# Patient Record
Sex: Male | Born: 1975 | Race: Black or African American | Hispanic: No | Marital: Married | Smoking: Never smoker
Health system: Southern US, Community
[De-identification: ages and names within clinical notes are randomized; demographics above are authoritative.]

## PROBLEM LIST (undated history)

## (undated) DIAGNOSIS — M25562 Pain in left knee: Secondary | ICD-10-CM

## (undated) DIAGNOSIS — S8990XA Unspecified injury of unspecified lower leg, initial encounter: Secondary | ICD-10-CM

## (undated) DIAGNOSIS — Z87438 Personal history of other diseases of male genital organs: Secondary | ICD-10-CM

## (undated) DIAGNOSIS — J309 Allergic rhinitis, unspecified: Secondary | ICD-10-CM

## (undated) DIAGNOSIS — I10 Essential (primary) hypertension: Secondary | ICD-10-CM

## (undated) DIAGNOSIS — M25561 Pain in right knee: Secondary | ICD-10-CM

## (undated) DIAGNOSIS — S8992XA Unspecified injury of left lower leg, initial encounter: Secondary | ICD-10-CM

## (undated) DIAGNOSIS — M545 Low back pain, unspecified: Secondary | ICD-10-CM

## (undated) DIAGNOSIS — Z973 Presence of spectacles and contact lenses: Secondary | ICD-10-CM

## (undated) DIAGNOSIS — A6000 Herpesviral infection of urogenital system, unspecified: Secondary | ICD-10-CM

## (undated) DIAGNOSIS — J45909 Unspecified asthma, uncomplicated: Secondary | ICD-10-CM

## (undated) HISTORY — DX: Low back pain: M54.5

## (undated) HISTORY — DX: Pain in right knee: M25.561

## (undated) HISTORY — DX: Herpesviral infection of urogenital system, unspecified: A60.00

## (undated) HISTORY — DX: Unspecified asthma, uncomplicated: J45.909

## (undated) HISTORY — DX: Unspecified injury of unspecified lower leg, initial encounter: S89.90XA

## (undated) HISTORY — DX: Unspecified injury of left lower leg, initial encounter: S89.92XA

## (undated) HISTORY — PX: NO PAST SURGERIES: SHX2092

## (undated) HISTORY — DX: Pain in left knee: M25.562

## (undated) HISTORY — DX: Low back pain, unspecified: M54.50

## (undated) HISTORY — DX: Essential (primary) hypertension: I10

## (undated) HISTORY — DX: Allergic rhinitis, unspecified: J30.9

## (undated) HISTORY — DX: Presence of spectacles and contact lenses: Z97.3

## (undated) HISTORY — DX: Personal history of other diseases of male genital organs: Z87.438

---

## 2013-09-29 ENCOUNTER — Encounter (HOSPITAL_COMMUNITY): Payer: Self-pay | Admitting: Emergency Medicine

## 2013-09-29 ENCOUNTER — Emergency Department (HOSPITAL_COMMUNITY)
Admission: EM | Admit: 2013-09-29 | Discharge: 2013-09-29 | Disposition: A | Discharge status: Home / Self Care | Payer: BC Managed Care – PPO | Source: Home / Self Care

## 2013-09-29 DIAGNOSIS — J309 Allergic rhinitis, unspecified: Secondary | ICD-10-CM

## 2013-09-29 DIAGNOSIS — R0982 Postnasal drip: Secondary | ICD-10-CM

## 2013-09-29 DIAGNOSIS — J029 Acute pharyngitis, unspecified: Secondary | ICD-10-CM

## 2013-09-29 LAB — POCT RAPID STREP A: STREPTOCOCCUS, GROUP A SCREEN (DIRECT): NEGATIVE

## 2013-09-29 NOTE — ED Provider Notes (Signed)
CSN: [id number]     Arrival date & time 09/29/13  1019 History   First MD Initiated Contact with Patient 09/29/13 1056     Chief Complaint  Patient presents with  . Sore Throat   (Consider location/radiation/quality/duration/timing/severity/associated sxs/prior Treatment) HPI Comments: 38 year old male complaining of a sore throat for 2 weeks. This is associated with nasal congestion and PND. He has been taking Zyrtec, Sudafed and Flonase on intermittent basis. When he began to feel better he will stop taking the medication. Denies fever, earache, cough or shortness of breath.   History reviewed. No pertinent past medical history. History reviewed. No pertinent past surgical history. History reviewed. No pertinent family history. History  Substance Use Topics  . Smoking status: Never Smoker   . Smokeless tobacco: Not on file  . Alcohol Use: Yes    Review of Systems  Constitutional: Negative for fever, diaphoresis, activity change and fatigue.  HENT: Positive for postnasal drip, rhinorrhea and sore throat. Negative for ear pain, facial swelling and trouble swallowing.   Eyes: Negative for pain, discharge and redness.  Respiratory: Negative for cough, chest tightness and shortness of breath.   Cardiovascular: Negative.   Gastrointestinal: Negative.   Musculoskeletal: Negative.  Negative for neck pain and neck stiffness.  Skin: Negative for rash.  Neurological: Negative.     Allergies  Review of patient's allergies indicates no known allergies.  Home Medications   Prior to Admission medications   Not on File   BP 157/61  Pulse 105  Temp(Src) 98.9 F (37.2 C) (Oral)  Resp 18  SpO2 97% Physical Exam  Nursing note and vitals reviewed. Constitutional: He is oriented to person, place, and time. He appears well-developed and well-nourished. No distress.  HENT:  Mouth/Throat: No oropharyngeal exudate.  Bilateral TMs are normal Oropharynx with moderate erythema, cobblestoning  and clear PND  Eyes: Conjunctivae and EOM are normal.  Neck: Normal range of motion. Neck supple.  Cardiovascular: Normal rate, regular rhythm and normal heart sounds.   Pulmonary/Chest: Effort normal and breath sounds normal. No respiratory distress. He has no wheezes. He has no rales.  Musculoskeletal: Normal range of motion. He exhibits no edema.  Lymphadenopathy:    He has no cervical adenopathy.  Neurological: He is alert and oriented to person, place, and time.  Skin: Skin is warm and dry. No rash noted.  Psychiatric: He has a normal mood and affect.    ED Course  Procedures (including critical care time) Labs Review Labs Reviewed  POCT RAPID STREP A ([facility]RG CARE ONLY)    Imaging Review No results found.   MDM   1. Allergic rhinitis   2. Allergic pharyngitis   3. PND (post-nasal drip)    FLonase, zyrtec,nasal saline, plenty of fluids, cepacol Rica Mast, NP 09/29/13 1115

## 2013-09-29 NOTE — ED Provider Notes (Signed)
Medical screening examination/treatment/procedure(s) were performed by non-physician practitioner and as supervising physician I was immediately available for consultation/collaboration.  Leslee Home, M.D.  Reuben Likes, MD 09/29/13 [phone]

## 2013-09-29 NOTE — ED Notes (Signed)
Reported 2 week duration of ST. Minimal relief w OTC medications, hot tea. NAD

## 2013-09-29 NOTE — Discharge Instructions (Signed)
Allergic Rhinitis COntinue daily zyrtec or allegra Flonase nasal spray Saline nasal spray as needed cloroseptic lozenges Lots of fluids Ibuprofen 800 mg every 8 hours as needed Sore Throat A sore throat is pain, burning, irritation, or scratchiness of the throat. There is often pain or tenderness when swallowing or talking. A sore throat may be accompanied by other symptoms, such as coughing, sneezing, fever, and swollen neck glands. A sore throat is often the first sign of another sickness, such as a cold, flu, strep throat, or mononucleosis (commonly known as mono). Most sore throats go away without medical treatment. CAUSES  The most common causes of a sore throat include:  A viral infection, such as a cold, flu, or mono.  A bacterial infection, such as strep throat, tonsillitis, or whooping cough.  Seasonal allergies.  Dryness in the air.  Irritants, such as smoke or pollution.  Gastroesophageal reflux disease (GERD). HOME CARE INSTRUCTIONS   Only take over-the-counter medicines as directed by your caregiver.  Drink enough fluids to keep your urine clear or pale yellow.  Rest as needed.  Try using throat sprays, lozenges, or sucking on hard candy to ease any pain (if older than 4 years or as directed).  Sip warm liquids, such as broth, herbal tea, or warm water with honey to relieve pain temporarily. You may also eat or drink cold or frozen liquids such as frozen ice pops.  Gargle with salt water (mix 1 tsp salt with 8 oz of water).  Do not smoke and avoid secondhand smoke.  Put a cool-mist humidifier in your bedroom at night to moisten the air. You can also turn on a hot shower and sit in the bathroom with the door closed for 5 10 minutes. SEEK IMMEDIATE MEDICAL CARE IF:  You have difficulty breathing.  You are unable to swallow fluids, soft foods, or your saliva.  You have increased swelling in the throat.  Your sore throat does not get better in 7 days.  You  have nausea and vomiting.  You have a fever or persistent symptoms for more than 2 3 days.  You have a fever and your symptoms suddenly get worse. MAKE SURE YOU:   Understand these instructions.  Will watch your condition.  Will get help right away if you are not doing well or get worse. Document Released: 05/23/2004 Document Revised: 04/01/2012 Document Reviewed: 12/22/2011 [facility] Patient Information 2014 [place].  Sore Throat A sore throat is pain, burning, irritation, or scratchiness of the throat. There is often pain or tenderness when swallowing or talking. A sore throat may be accompanied by other symptoms, such as coughing, sneezing, fever, and swollen neck glands. A sore throat is often the first sign of another sickness, such as a cold, flu, strep throat, or mononucleosis (commonly known as mono). Most sore throats go away without medical treatment. CAUSES  The most common causes of a sore throat include:  A viral infection, such as a cold, flu, or mono.  A bacterial infection, such as strep throat, tonsillitis, or whooping cough.  Seasonal allergies.  Dryness in the air.  Irritants, such as smoke or pollution.  Gastroesophageal reflux disease (GERD). HOME CARE INSTRUCTIONS   Only take over-the-counter medicines as directed by your caregiver.  Drink enough fluids to keep your urine clear or pale yellow.  Rest as needed.  Try using throat sprays, lozenges, or sucking on hard candy to ease any pain (if older than 4 years or as directed).  Sip warm  liquids, such as broth, herbal tea, or warm water with honey to relieve pain temporarily. You may also eat or drink cold or frozen liquids such as frozen ice pops.  Gargle with salt water (mix 1 tsp salt with 8 oz of water).  Do not smoke and avoid secondhand smoke.  Put a cool-mist humidifier in your bedroom at night to moisten the air. You can also turn on a hot shower and sit in the bathroom with the door  closed for 5 10 minutes. SEEK IMMEDIATE MEDICAL CARE IF:  You have difficulty breathing.  You are unable to swallow fluids, soft foods, or your saliva.  You have increased swelling in the throat.  Your sore throat does not get better in 7 days.  You have nausea and vomiting.  You have a fever or persistent symptoms for more than 2 3 days.  You have a fever and your symptoms suddenly get worse. MAKE SURE YOU:   Understand these instructions.  Will watch your condition.  Will get help right away if you are not doing well or get worse. Document Released: 05/23/2004 Document Revised: 04/01/2012 Document Reviewed: 12/22/2011 [facility] Patient Information 2014 [place], [place].  Allergic rhinitis is when the mucous membranes in the nose respond to allergens. Allergens are particles in the air that cause your body to have an allergic reaction. This causes you to release allergic antibodies. Through a chain of events, these eventually cause you to release histamine into the blood stream. Although meant to protect the body, it is this release of histamine that causes your discomfort, such as frequent sneezing, congestion, and an itchy, runny nose.  CAUSES  Seasonal allergic rhinitis (hay fever) is caused by pollen allergens that may come from grasses, trees, and weeds. Year-round allergic rhinitis (perennial allergic rhinitis) is caused by allergens such as house dust mites, pet dander, and mold spores.  SYMPTOMS   Nasal stuffiness (congestion).  Itchy, runny nose with sneezing and tearing of the eyes. DIAGNOSIS  Your health care provider can help you determine the allergen or allergens that trigger your symptoms. If you and your health care provider are unable to determine the allergen, skin or blood testing may be used. TREATMENT  Allergic Rhinitis does not have a cure, but it can be controlled by:  Medicines and allergy shots (immunotherapy).  Avoiding the allergen. Hay fever may  often be treated with antihistamines in pill or nasal spray forms. Antihistamines block the effects of histamine. There are over-the-counter medicines that may help with nasal congestion and swelling around the eyes. Check with your health care provider before taking or giving this medicine.  If avoiding the allergen or the medicine prescribed do not work, there are many new medicines your health care provider can prescribe. Stronger medicine may be used if initial measures are ineffective. Desensitizing injections can be used if medicine and avoidance does not work. Desensitization is when a patient is given ongoing shots until the body becomes less sensitive to the allergen. Make sure you follow up with your health care provider if problems continue. HOME CARE INSTRUCTIONS It is not possible to completely avoid allergens, but you can reduce your symptoms by taking steps to limit your exposure to them. It helps to know exactly what you are allergic to so that you can avoid your specific triggers. SEEK MEDICAL CARE IF:   You have a fever.  You develop a cough that does not stop easily (persistent).  You have shortness of breath.  You  start wheezing.  Symptoms interfere with normal daily activities. Document Released: 01/08/2001 Document Revised: 02/03/2013 Document Reviewed: 12/21/2012 [facility] Patient Information 2014 [place], [place].

## 2013-10-01 LAB — CULTURE, GROUP A STREP

## 2014-07-20 ENCOUNTER — Ambulatory Visit (INDEPENDENT_AMBULATORY_CARE_PROVIDER_SITE_OTHER): Payer: BLUE CROSS/BLUE SHIELD | Admitting: Medical

## 2014-07-20 ENCOUNTER — Encounter: Payer: Self-pay | Admitting: Medical

## 2014-07-20 VITALS — BP 130/90 | HR 76 | Temp 98.4°F | Resp 15 | Ht 77.0 in | Wt 266.0 lb

## 2014-07-20 DIAGNOSIS — Z113 Encounter for screening for infections with a predominantly sexual mode of transmission: Secondary | ICD-10-CM | POA: Diagnosis not present

## 2014-07-20 DIAGNOSIS — M25561 Pain in right knee: Secondary | ICD-10-CM

## 2014-07-20 DIAGNOSIS — K4090 Unilateral inguinal hernia, without obstruction or gangrene, not specified as recurrent: Secondary | ICD-10-CM | POA: Diagnosis not present

## 2014-07-20 DIAGNOSIS — M25562 Pain in left knee: Secondary | ICD-10-CM | POA: Diagnosis not present

## 2014-07-20 DIAGNOSIS — Z23 Encounter for immunization: Secondary | ICD-10-CM

## 2014-07-20 DIAGNOSIS — M545 Low back pain: Secondary | ICD-10-CM | POA: Diagnosis not present

## 2014-07-20 DIAGNOSIS — R030 Elevated blood-pressure reading, without diagnosis of hypertension: Secondary | ICD-10-CM

## 2014-07-20 DIAGNOSIS — Z0000 Encounter for general adult medical examination without abnormal findings: Secondary | ICD-10-CM

## 2014-07-20 LAB — CBC WITH DIFFERENTIAL/PLATELET
BASOS PCT: 1 % (ref 0–1)
Basophils Absolute: 0.1 10*3/uL (ref 0.0–0.1)
EOS ABS: 0.2 10*3/uL (ref 0.0–0.7)
Eosinophils Relative: 3 % (ref 0–5)
HEMATOCRIT: 43.4 % (ref 39.0–52.0)
HEMOGLOBIN: 14.1 g/dL (ref 13.0–17.0)
Lymphocytes Relative: 32 % (ref 12–46)
Lymphs Abs: 2.4 10*3/uL (ref 0.7–4.0)
MCH: 27 pg (ref 26.0–34.0)
MCHC: 32.5 g/dL (ref 30.0–36.0)
MCV: 83.1 fL (ref 78.0–100.0)
MONO ABS: 0.5 10*3/uL (ref 0.1–1.0)
MONOS PCT: 7 % (ref 3–12)
MPV: 10.4 fL (ref 8.6–12.4)
Neutro Abs: 4.3 10*3/uL (ref 1.7–7.7)
Neutrophils Relative %: 57 % (ref 43–77)
Platelets: 269 10*3/uL (ref 150–400)
RBC: 5.22 MIL/uL (ref 4.22–5.81)
RDW: 13.3 % (ref 11.5–15.5)
WBC: 7.6 10*3/uL (ref 4.0–10.5)

## 2014-07-20 LAB — POCT URINALYSIS DIPSTICK
BILIRUBIN UA: NEGATIVE
Glucose, UA: NEGATIVE
KETONES UA: NEGATIVE
Leukocytes, UA: NEGATIVE
Nitrite, UA: NEGATIVE
Protein, UA: NEGATIVE
RBC UA: NEGATIVE
SPEC GRAV UA: 1.025
Urobilinogen, UA: NEGATIVE
pH, UA: 6.5

## 2014-07-20 LAB — COMPREHENSIVE METABOLIC PANEL
ALBUMIN: 4.5 g/dL (ref 3.5–5.2)
ALK PHOS: 44 U/L (ref 39–117)
ALT: 15 U/L (ref 0–53)
AST: 16 U/L (ref 0–37)
BUN: 12 mg/dL (ref 6–23)
CO2: 28 meq/L (ref 19–32)
CREATININE: 1.03 mg/dL (ref 0.50–1.35)
Calcium: 9.7 mg/dL (ref 8.4–10.5)
Chloride: 102 mEq/L (ref 96–112)
GLUCOSE: 82 mg/dL (ref 70–99)
Potassium: 4.1 mEq/L (ref 3.5–5.3)
SODIUM: 140 meq/L (ref 135–145)
Total Bilirubin: 1 mg/dL (ref 0.2–1.2)
Total Protein: 6.9 g/dL (ref 6.0–8.3)

## 2014-07-20 LAB — LIPID PANEL
Cholesterol: 174 mg/dL (ref 0–200)
HDL: 43 mg/dL (ref >=40)
LDL CALC: 115 mg/dL — AB (ref 0–99)
Total CHOL/HDL Ratio: 4 Ratio
Triglycerides: 82 mg/dL (ref <150)
VLDL: 16 mg/dL (ref 0–40)

## 2014-07-20 LAB — TSH: TSH: 2.14 u[IU]/mL (ref 0.350–4.500)

## 2014-07-20 NOTE — Progress Notes (Signed)
Subjective:   HPI  Carl Wallace is a 39 y.o. male who presents for a complete physical.  New patient today.    Preventative care: Last ophthalmology visit: yes [facility] [place] Last dental visit:yes Dr. Alvester MorinBell Last colonoscopy:n/a Last prostate exam: n/a Last EKG:n/a Last labs:new  Prior vaccinations: TD or Tdap:unsure Influenza:never Pneumococcal:n/a Shingles/Zostavax:n/a  Concerns: Notes that he exercises with walking every day at work, runs sometimes, plays basketball couple times a month but normally plays 2 or 3 full court games in a row. Lately he seems to get bilateral knee pain and swelling a few hours after plan basketball, he does note an injury to the left knee in the remote past with tears of the muscles but didn't end up needing surgery. He also gets low back pain from time to time, has thought about seeing a chiropractor  Reviewed their medical, surgical, family, social, medication, and allergy history and updated chart as appropriate.  Past Medical History  Diagnosis Date  . Childhood asthma   . Allergic rhinitis   . Wears glasses     alternates between glasses and contacts  . Genital herpes   . Knee pain, bilateral   . Low back pain   . Knee injury     left knee remote past  . H/O testicular mass     evaluatd by Urology 2006  . Left knee injury     remote past with muscle tear    Past Surgical History  Procedure Laterality Date  . No past surgeries      History   Social History  . Marital Status: Married    Spouse Name: N/A  . Number of Children: N/A  . Years of Education: N/A   Occupational History  . Not on file.   Social History Main Topics  . Smoking status: Never Smoker   . Smokeless tobacco: Not on file  . Alcohol Use: 0.6 oz/week    1 Cans of beer per week  . Drug Use: No  . Sexual Activity: Not on file   Other Topics Concern  . Not on file   Social History Narrative   Married, has 2 children,  5yo and 7yo, exercise some with walking, basetball. Non smoker, christian.  Production designer, theatre/television/filmManager at [employer] [place]    Family History  Problem Relation Age of Onset  . Hypertension Mother   . Hypertension Brother   . Asthma Daughter   . Diabetes Maternal Grandmother   . Hypertension Maternal Grandmother     No current outpatient prescriptions on file.  No Known Allergies   Review of Systems Constitutional: -fever, -chills, -sweats, -unexpected weight change, -decreased appetite, -fatigue Allergy: -sneezing, -itching, -congestion Dermatology: -changing moles, --rash, -lumps ENT: -runny nose, -ear pain, -sore throat, -hoarseness, -sinus pain, -teeth pain, - ringing in ears, -hearing loss, -nosebleeds Cardiology: -chest pain, -palpitations, -swelling, -difficulty breathing when lying flat, -waking up short of breath Respiratory: -cough, -shortness of breath, -difficulty breathing with exercise or exertion, -wheezing, -coughing up blood Gastroenterology: -abdominal pain, -nausea, -vomiting, -diarrhea, -constipation, -blood in stool, -changes in bowel movement, -difficulty swallowing or eating Hematology: -bleeding, -bruising  Musculoskeletal: +joint aches(Knee), -muscle aches, -joint swelling, +back pain, -neck pain, -cramping, -changes in gait Ophthalmology: denies vision changes, eye redness, itching, discharge Urology: -burning with urination, -difficulty urinating, -blood in urine, -urinary frequency, -urgency, -incontinence Neurology: -headache, -weakness, -tingling, -numbness, -memory loss, -falls, -dizziness Psychology: -depressed mood, -agitation, -sleep problems     Objective:  Physical Exam  BP 130/90 mmHg  Pulse 76  Temp(Src) 98.4 F (36.9 C) (Oral)  Resp 15  Ht  (1.956 m)  Wt 266 lb (120.657 kg)  BMI 31.54 kg/m2  General appearance: alert, no distress, WD/WN,  Skin: Right arm with small brown 3 mm diameter flat round macule of right forearm volar surface,  right lateral elbow with 2 mm raised brown papule unchanged for years, tattoo right upper arm, no worrisome lesions HEENT: normocephalic, conjunctiva/corneas normal, sclerae anicteric, PERRLA, EOMi, nares patent, no discharge or erythema, pharynx normal Oral cavity: MMM, tongue normal, teeth in good repair Neck: supple, no lymphadenopathy, no thyromegaly, no masses, normal ROM, no bruits Chest: non tender, normal shape and expansion Heart: RRR, normal S1, S2, no murmurs Lungs: CTA bilaterally, no wheezes, rhonchi, or rales Abdomen: +bs, soft, non tender, non distended, no masses, no hepatomegaly, no splenomegaly, no bruits Back: non tender, normal ROM, no scoliosis Musculoskeletal: upper extremities non tender, no obvious deformity, normal ROM throughout, lower extremities non tender, no obvious deformity, normal ROM throughout Extremities: no edema, no cyanosis, no clubbing Pulses: 2+ symmetric, upper and lower extremities, normal cap refill Neurological: alert, oriented x 3, CN2-12 intact, strength normal upper extremities and lower extremities, sensation normal throughout, DTRs 2+ throughout, no cerebellar signs, gait normal Psychiatric: normal affect, behavior normal, pleasant  GU: normal male external genitalia, circumcised , nontender, no masses, small left inguinal hernia reducible, no lymphadenopathy Rectal: deferred   Assessment and Plan :      Encounter Diagnoses  Name Primary?  . Encounter for health maintenance examination in adult Yes  . Screen for STD (sexually transmitted disease)   . Need for TD vaccine   . Need for prophylactic vaccination and inoculation against influenza   . Elevated blood pressure reading without diagnosis of hypertension   . Bilateral knee pain   . Low back pain, unspecified back pain laterality, with sciatica presence unspecified   . Inguinal hernia, left     Physical exam - discussed healthy lifestyle, diet, exercise, preventative care,  vaccinations, and addressed their concerns.   Routine labs today and STD labs today Counseled on the Td (tetanus, diptheria) vaccine.  Vaccine information sheet given. Td vaccine given after consent obtained. Counseled on the influenza virus vaccine.  Vaccine information sheet given.  Influenza vaccine given after consent obtained. See your eye doctor yearly for routine vision care. See your dentist yearly for routine dental care including hygiene visits twice yearly. Discussed his concerns of bilateral knee pain and back pain, advised he try to play basketball on a regular basis instead of just every now and then as he is probably overusing and overdoing it in relation to his joints. Whereas if he would gradually build up his tolerance and endurance and use this type of exercise regularly he probably wouldn't have the same issues advised he try ibuprofen prior to exercise and then ice and elevation after exercise and 6 hours later after the first ibuprofen doing a second round of ibuprofen area also discussed daily stretching routine and daily exercise Follow-up pending labs

## 2014-07-21 ENCOUNTER — Other Ambulatory Visit: Payer: Self-pay | Admitting: Medical

## 2014-07-21 ENCOUNTER — Encounter: Payer: Self-pay | Admitting: Medical

## 2014-07-21 DIAGNOSIS — M545 Low back pain, unspecified: Secondary | ICD-10-CM | POA: Insufficient documentation

## 2014-07-21 DIAGNOSIS — M25562 Pain in left knee: Secondary | ICD-10-CM

## 2014-07-21 DIAGNOSIS — R030 Elevated blood-pressure reading, without diagnosis of hypertension: Secondary | ICD-10-CM | POA: Insufficient documentation

## 2014-07-21 DIAGNOSIS — M25561 Pain in right knee: Secondary | ICD-10-CM | POA: Insufficient documentation

## 2014-07-21 LAB — HIV ANTIBODY (ROUTINE TESTING W REFLEX): HIV 1&2 Ab, 4th Generation: NONREACTIVE

## 2014-07-21 LAB — RPR

## 2014-07-21 LAB — GC/CHLAMYDIA PROBE AMP
CT PROBE, AMP APTIMA: NEGATIVE
GC Probe RNA: NEGATIVE

## 2014-07-21 MED ORDER — VALACYCLOVIR HCL 500 MG PO TABS: ORAL_TABLET | ORAL | Status: DC

## 2014-07-21 NOTE — Progress Notes (Signed)
LM to [facility] 

## 2014-08-11 ENCOUNTER — Encounter: Payer: Self-pay | Admitting: Medical

## 2014-08-11 ENCOUNTER — Ambulatory Visit (INDEPENDENT_AMBULATORY_CARE_PROVIDER_SITE_OTHER): Payer: BLUE CROSS/BLUE SHIELD | Admitting: Medical

## 2014-08-11 VITALS — BP 132/90 | HR 78 | Temp 97.9°F | Resp 16 | Wt 266.0 lb

## 2014-08-11 DIAGNOSIS — M545 Low back pain: Secondary | ICD-10-CM

## 2014-08-11 DIAGNOSIS — M25562 Pain in left knee: Secondary | ICD-10-CM

## 2014-08-11 DIAGNOSIS — M25561 Pain in right knee: Secondary | ICD-10-CM

## 2014-08-11 DIAGNOSIS — R030 Elevated blood-pressure reading, without diagnosis of hypertension: Secondary | ICD-10-CM | POA: Diagnosis not present

## 2014-08-11 DIAGNOSIS — N528 Other male erectile dysfunction: Secondary | ICD-10-CM

## 2014-08-11 MED ORDER — SILDENAFIL CITRATE 50 MG PO TABS: ORAL_TABLET | ORAL | Status: DC

## 2014-08-11 NOTE — Progress Notes (Signed)
Subjective: Here for f/u on several issues  He reports problems with erections/sexual function.   Married, says relationship with wife is good, but sexual function/sexual relations not what is use to be.   Sometimes erections not full, sometimes reaches ejaculation too fast, sometimes just doesn't seem to have the ability to achieve climax as in the past.   Often intercourse is more the act and less foreplay than in the past.  Doesn't get morning erections like in the past.   Does get erections with intercourse most of the time, and no problems with masturbation.   Was curious about Viagra.  No prior treatment or eval with medication.  Has had recent elevated BPs but no diagnosis of HTN, no prior medication for this.   He denies chest pain, shortness breath, palpitations, edema.  He reports ongoing problems with his knees and back.  He reports that he exercises with walking every day at work, runs sometimes, plays basketball couple times a month but normally plays 2 or 3 full court games in a row. Lately he seems to get bilateral knee pain and swelling a few hours after plan basketball, he does note an injury to the left knee in the remote past with tears of the muscles but didn't end up needing surgery. The knee pain is generally at the joint line bilaterally of both knees He also gets low back pain from time to time, has thought about seeing a chiropractor.  No pain radiating from the back to the legs, no numbness or tingling or weakness.  Review of systems as in subjective  Objective: BP 132/90 mmHg  Pulse 78  Temp(Src) 97.9 F (36.6 C) (Oral)  Resp 16  Wt 266 lb (120.657 kg)  BP Readings from Last 3 Encounters:  08/11/14 132/90  07/20/14 130/90  09/29/13 157/61    General appearance: alert, no distress, WD/WN,  Heart: RRR, normal S1, S2, no murmurs Lungs: CTA bilaterally, no wheezes, rhonchi, or rales Abdomen: +bs, soft, non tender, non distended, no masses, no hepatomegaly, no  splenomegaly, no bruits Back: non tender, normal ROM, no scoliosis Musculoskeletal: Bilateral knees without tenderness no swelling no deformity normal range of motion no laxity, basically normal exam. rest of leg exam unremarkable Extremities: no edema, no cyanosis, no clubbing Pulses: 2+ symmetric, upper and lower extremities, normal cap refill Neurological: Normal leg sensation strength and DTRs   Assessment: Encounter Diagnoses  Name Primary?  . Other male erectile dysfunction Yes  . Elevated blood pressure reading without diagnosis of hypertension   . Knee pain, bilateral   . Low back pain without sciatica, unspecified back pain laterality     Plan: Erectile Dysfunction - Reviewed pathophysiology and differential diagnosis of erectile dysfunction with the patient.  Discussed treatment options. I suspect he has a combination of ED related to psychological factors, lack of foreplay, needing to work with wife to make the environment conducive for intimacy, counseled in general on setting the tone for things to work fine.  He also seems to have HTN, so discussed the role of BP and organic causes of ED.  Begin trial of Viagra.  Discussed potential risks of medications including hypotension and priapism.  Discussed proper use of medication.  Questions were answered.  Recheck with call back in a week  Elevated BP - he will check BPs weekly, and let me know readings in 1-2 weeks.  Discussed likely diagnosis of hypertension  Knee pain bilat - go for xrays, begin Aleve OTC prn or  daily for the next week or 2, can do ice when worse, swollen, leg elevation and relative rest.  Back pain - pending knee xrays, consider PT

## 2014-08-19 ENCOUNTER — Ambulatory Visit
Admission: RE | Admit: 2014-08-19 | Discharge: 2014-08-19 | Disposition: A | Discharge status: Home / Self Care | Payer: BLUE CROSS/BLUE SHIELD | Source: Ambulatory Visit | Attending: Medical | Admitting: Medical

## 2014-08-19 ENCOUNTER — Ambulatory Visit
Admission: RE | Admit: 2014-08-19 | Discharge: 2014-08-19 | Disposition: A | Discharge status: Home / Self Care | Payer: Self-pay | Source: Ambulatory Visit | Attending: Medical | Admitting: Medical

## 2014-08-19 DIAGNOSIS — M25562 Pain in left knee: Principal | ICD-10-CM

## 2014-08-19 DIAGNOSIS — M25561 Pain in right knee: Secondary | ICD-10-CM

## 2014-08-31 ENCOUNTER — Ambulatory Visit: Payer: Self-pay | Admitting: Medical

## 2014-09-06 ENCOUNTER — Ambulatory Visit: Payer: Self-pay | Admitting: Family Medicine

## 2014-09-13 ENCOUNTER — Ambulatory Visit: Payer: BLUE CROSS/BLUE SHIELD | Admitting: Family Medicine

## 2014-09-14 ENCOUNTER — Ambulatory Visit (INDEPENDENT_AMBULATORY_CARE_PROVIDER_SITE_OTHER): Payer: BLUE CROSS/BLUE SHIELD | Admitting: Family Medicine

## 2014-09-14 ENCOUNTER — Encounter: Payer: Self-pay | Admitting: Family Medicine

## 2014-09-14 VITALS — BP 142/88 | HR 81 | Wt 268.0 lb

## 2014-09-14 DIAGNOSIS — M2240 Chondromalacia patellae, unspecified knee: Secondary | ICD-10-CM | POA: Diagnosis not present

## 2014-09-14 DIAGNOSIS — M25561 Pain in right knee: Secondary | ICD-10-CM

## 2014-09-14 DIAGNOSIS — M25562 Pain in left knee: Secondary | ICD-10-CM | POA: Diagnosis not present

## 2014-09-14 MED ORDER — TRIAMCINOLONE ACETONIDE 40 MG/ML IJ SUSP
20.0000 mg | Freq: Once | INTRAMUSCULAR | Status: AC
  Administered 2014-09-14: 20 mg via INTRAMUSCULAR

## 2014-09-14 MED ORDER — LIDOCAINE HCL (PF) 1 % IJ SOLN
2.0000 mL | Freq: Once | INTRAMUSCULAR | Status: AC
  Administered 2014-09-14: 2 mL via INTRADERMAL

## 2014-09-14 NOTE — Patient Instructions (Signed)
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 Do 3 sets of 10 3 times a day for the next 3 weeks of terminal extension exercises

## 2014-09-14 NOTE — Progress Notes (Signed)
   Subjective:    Patient ID: Carl Wallace, male    DOB: 12-Jun-1975, 39 y.o.   MRN: [id number]  HPI He is here for consult concerning bilateral knee pain, right greater than left. He was seen recently and x-rays ordered which did show some minor degenerative changes. He describes knee pain worse with sitting and less pain if he stands and moves around. He is able to play sports without much difficulty. His been no popping, locking or grinding. He has no difficulty going up or down stairs.   Review of Systems     Objective:   Physical Exam Exam of the right knee does show a slightly positive anterior drawer. McMurray's testing negative. No tenderness palpation to the joint line. Patellar compression testing was negative.       Assessment & Plan:  Chondromalacia, patella, unspecified laterality - Plan: triamcinolone acetonide (KENALOG-40) injection 20 mg, lidocaine (PF) (XYLOCAINE) 1 % injection 2 mL  Bilateral knee pain I explained that his symptoms are more consistent with chondromalacia. Discussed terminal extension exercises and demonstrated them to him. He is to do these for the next 3 weeks and then recheck. Also discussed the arthritic symptoms in his right knee. Explained that x-ray findings versus clinical can be quite different.

## 2015-04-30 DIAGNOSIS — I10 Essential (primary) hypertension: Secondary | ICD-10-CM

## 2015-04-30 HISTORY — DX: Essential (primary) hypertension: I10

## 2015-05-18 ENCOUNTER — Encounter: Payer: Self-pay | Admitting: Medical

## 2015-05-18 ENCOUNTER — Ambulatory Visit (INDEPENDENT_AMBULATORY_CARE_PROVIDER_SITE_OTHER): Payer: BLUE CROSS/BLUE SHIELD | Admitting: Medical

## 2015-05-18 VITALS — BP 128/94 | HR 74 | Ht 77.0 in | Wt 258.0 lb

## 2015-05-18 DIAGNOSIS — K4090 Unilateral inguinal hernia, without obstruction or gangrene, not specified as recurrent: Secondary | ICD-10-CM

## 2015-05-18 DIAGNOSIS — K469 Unspecified abdominal hernia without obstruction or gangrene: Secondary | ICD-10-CM | POA: Insufficient documentation

## 2015-05-18 DIAGNOSIS — E668 Other obesity: Secondary | ICD-10-CM

## 2015-05-18 DIAGNOSIS — Z0000 Encounter for general adult medical examination without abnormal findings: Secondary | ICD-10-CM | POA: Diagnosis not present

## 2015-05-18 DIAGNOSIS — Z23 Encounter for immunization: Secondary | ICD-10-CM | POA: Insufficient documentation

## 2015-05-18 DIAGNOSIS — IMO0002 Reserved for concepts with insufficient information to code with codable children: Secondary | ICD-10-CM

## 2015-05-18 DIAGNOSIS — I10 Essential (primary) hypertension: Secondary | ICD-10-CM | POA: Diagnosis not present

## 2015-05-18 LAB — CBC
HEMATOCRIT: 42.1 % (ref 39.0–52.0)
HEMOGLOBIN: 13.6 g/dL (ref 13.0–17.0)
MCH: 26.9 pg (ref 26.0–34.0)
MCHC: 32.3 g/dL (ref 30.0–36.0)
MCV: 83.2 fL (ref 78.0–100.0)
MPV: 10.8 fL (ref 8.6–12.4)
Platelets: 258 10*3/uL (ref 150–400)
RBC: 5.06 MIL/uL (ref 4.22–5.81)
RDW: 13.9 % (ref 11.5–15.5)
WBC: 6.7 10*3/uL (ref 4.0–10.5)

## 2015-05-18 LAB — COMPREHENSIVE METABOLIC PANEL
ALBUMIN: 4.2 g/dL (ref 3.6–5.1)
ALT: 15 U/L (ref 9–46)
AST: 19 U/L (ref 10–40)
Alkaline Phosphatase: 39 U/L — ABNORMAL LOW (ref 40–115)
BUN: 13 mg/dL (ref 7–25)
CO2: 29 mmol/L (ref 20–31)
CREATININE: 0.93 mg/dL (ref 0.60–1.35)
Calcium: 9.7 mg/dL (ref 8.6–10.3)
Chloride: 102 mmol/L (ref 98–110)
Glucose, Bld: 85 mg/dL (ref 65–99)
POTASSIUM: 4.2 mmol/L (ref 3.5–5.3)
Sodium: 139 mmol/L (ref 135–146)
TOTAL PROTEIN: 6.6 g/dL (ref 6.1–8.1)
Total Bilirubin: 0.9 mg/dL (ref 0.2–1.2)

## 2015-05-18 LAB — LIPID PANEL
CHOL/HDL RATIO: 3.8 ratio (ref <=5.0)
CHOLESTEROL: 173 mg/dL (ref 125–200)
HDL: 45 mg/dL (ref >=40)
LDL Cholesterol: 109 mg/dL (ref <130)
Triglycerides: 97 mg/dL (ref <150)
VLDL: 19 mg/dL (ref <30)

## 2015-05-18 NOTE — Patient Instructions (Signed)
Hypertension Hypertension, commonly called high blood pressure, is when the force of blood pumping through your arteries is too strong. Your arteries are the blood vessels that carry blood from your heart throughout your body. A blood pressure reading consists of a higher number over a lower number, such as 110/72. The higher number (systolic) is the pressure inside your arteries when your heart pumps. The lower number (diastolic) is the pressure inside your arteries when your heart relaxes. Ideally you want your blood pressure below 120/80. Hypertension forces your heart to work harder to pump blood. Your arteries may become narrow or stiff. Having hypertension puts you at risk for heart disease, stroke, and other problems.  RISK FACTORS Some risk factors for high blood pressure are controllable. Others are not.  Risk factors you cannot control include:   Race. You may be at higher risk if you are African American.  Age. Risk increases with age.  Gender. Men are at higher risk than women before age 37 years. After age 67, women are at higher risk than men. Risk factors you can control include:  Not getting enough exercise or physical activity.  Being overweight.  Getting too much fat, sugar, calories, or salt in your diet.  Drinking too much alcohol. SIGNS AND SYMPTOMS Hypertension does not usually cause signs or symptoms. Extremely high blood pressure (hypertensive crisis) may cause headache, anxiety, shortness of breath, and nosebleed. DIAGNOSIS  To check if you have hypertension, your health care provider will measure your blood pressure while you are seated, with your arm held at the level of your heart. It should be measured at least twice using the same arm. Certain conditions can cause a difference in blood pressure between your right and left arms. A blood pressure reading that is higher than normal on one occasion does not mean that you need treatment. If one blood pressure reading  is high, ask your health care provider about having it checked again. BLOOD PRESSURE STAGES Blood pressure is classified into four stages: normal, prehypertension, stage 1, and stage 2. Your blood pressure reading will be used to determine what type of treatment, if any, is necessary. Appropriate treatment options are tied to these four stages:  Normal  Systolic pressure (mm Hg): below 120.  Diastolic pressure (mm Hg): below 80. Prehypertension  Systolic pressure (mm Hg): 120 to 139.  Diastolic pressure (mm Hg): 80 to 89. Stage1  Systolic pressure (mm Hg): 140 to 159.  Diastolic pressure (mm Hg): 90 to 99. Stage2  Systolic pressure (mm Hg): 160 or above.  Diastolic pressure (mm Hg): 100 or above. RISKS RELATED TO HIGH BLOOD PRESSURE Managing your blood pressure is an important responsibility. Uncontrolled high blood pressure can lead to:  A heart attack.  A stroke.  A weakened blood vessel (aneurysm).  Heart failure.  Kidney damage.  Eye damage.  Metabolic syndrome.  Memory and concentration problems. TREATMENT  Treating high blood pressure includes making lifestyle changes and possibly taking medicine. Living a healthy lifestyle can help lower high blood pressure. You may need to change some of your habits. Lifestyle changes may include:  Following the DASH diet. This diet is high in fruits, vegetables, and whole grains. It is low in salt, red meat, and added sugars.  Getting at least 2 hours of brisk physical activity every week.  Losing weight if necessary.  Not smoking.  Limiting alcoholic beverages.  Learning ways to reduce stress. If lifestyle changes are not enough to get your blood pressure  under control, your health care provider may prescribe medicine. You may need to take more than one. Work closely with your health care provider to understand the risks and benefits. HOME CARE INSTRUCTIONS  Have your blood pressure rechecked as directed by  your health care provider.   Take medicines only as directed by your health care provider. Follow the directions carefully. Blood pressure medicines must be taken as prescribed. The medicine does not work as well when you skip doses. Skipping doses also puts you at risk for problems.   Do not smoke.   Monitor your blood pressure at home as directed by your health care provider. SEEK MEDICAL CARE IF:   You think you are having a reaction to medicines taken.  You have recurrent headaches or feel dizzy.  You have swelling in your ankles.  You have trouble with your vision. SEEK IMMEDIATE MEDICAL CARE IF:  You develop a severe headache or confusion.  You have unusual weakness, numbness, or feel faint.  You have severe chest or abdominal pain.  You vomit repeatedly.  You have trouble breathing. MAKE SURE YOU:   Understand these instructions.  Will watch your condition.  Will get help right away if you are not doing well or get worse. Document Released: 04/15/2005 Document Revised: 08/30/2013 Document Reviewed: 02/05/2013 [facility] Patient Information 2015 [place], [place]. This information is not intended to replace advice given to you by your health care provider. Make sure you discuss any questions you have with your health care provider.    DASH Eating Plan DASH stands for "Dietary Approaches to Stop Hypertension." The DASH eating plan is a healthy eating plan that has been shown to reduce high blood pressure (hypertension). Additional health benefits may include reducing the risk of type 2 diabetes mellitus, heart disease, and stroke. The DASH eating plan may also help with weight loss. WHAT DO I NEED TO KNOW ABOUT THE DASH EATING PLAN? For the DASH eating plan, you will follow these general guidelines:  Choose foods with a percent daily value for sodium of less than 5% (as listed on the food label).  Use salt-free seasonings or herbs instead of table salt or sea  salt.  Check with your health care provider or pharmacist before using salt substitutes.  Eat lower-sodium products, often labeled as "lower sodium" or "no salt added."  Eat fresh foods.  Eat more vegetables, fruits, and low-fat dairy products.  Choose whole grains. Look for the word "whole" as the first word in the ingredient list.  Choose fish and skinless chicken or [place] more often than red meat. Limit fish, poultry, and meat to 6 oz (170 g) each day.  Limit sweets, desserts, sugars, and sugary drinks.  Choose heart-healthy fats.  Limit cheese to 1 oz (28 g) per day.  Eat more home-cooked food and less restaurant, buffet, and fast food.  Limit fried foods.  Cook foods using methods other than frying.  Limit canned vegetables. If you do use them, rinse them well to decrease the sodium.  When eating at a restaurant, ask that your food be prepared with less salt, or no salt if possible. WHAT FOODS CAN I EAT? Seek help from a dietitian for individual calorie needs. Grains Whole grain or whole wheat bread. Brown rice. Whole grain or whole wheat pasta. Quinoa, bulgur, and whole grain cereals. Low-sodium cereals. Corn or whole wheat flour tortillas. Whole grain cornbread. Whole grain crackers. Low-sodium crackers. Vegetables Fresh or frozen vegetables (raw, steamed, roasted, or grilled).  Low-sodium or reduced-sodium tomato and vegetable juices. Low-sodium or reduced-sodium tomato sauce and paste. Low-sodium or reduced-sodium canned vegetables.  Fruits All fresh, canned (in natural juice), or frozen fruits. Meat and Other Protein Products Ground beef (85% or leaner), grass-fed beef, or beef trimmed of fat. Skinless chicken or [place]. Ground chicken or [place]. Pork trimmed of fat. All fish and seafood. Eggs. Dried beans, peas, or lentils. Unsalted nuts and seeds. Unsalted canned beans. Dairy Low-fat dairy products, such as skim or 1% milk, 2% or reduced-fat cheeses, low-fat  ricotta or cottage cheese, or plain low-fat yogurt. Low-sodium or reduced-sodium cheeses. Fats and Oils Tub margarines without trans fats. Light or reduced-fat mayonnaise and salad dressings (reduced sodium). Avocado. Safflower, olive, or canola oils. Natural peanut or almond butter. Other Unsalted popcorn and pretzels. The items listed above may not be a complete list of recommended foods or beverages. Contact your dietitian for more options. WHAT FOODS ARE NOT RECOMMENDED? Grains White bread. White pasta. White rice. Refined cornbread. Bagels and croissants. Crackers that contain trans fat. Vegetables Creamed or fried vegetables. Vegetables in a cheese sauce. Regular canned vegetables. Regular canned tomato sauce and paste. Regular tomato and vegetable juices. Fruits Dried fruits. Canned fruit in light or heavy syrup. Fruit juice. Meat and Other Protein Products Fatty cuts of meat. Ribs, chicken wings, bacon, sausage, bologna, salami, chitterlings, fatback, hot dogs, bratwurst, and packaged luncheon meats. Salted nuts and seeds. Canned beans with salt. Dairy Whole or 2% milk, cream, half-and-half, and cream cheese. Whole-fat or sweetened yogurt. Full-fat cheeses or blue cheese. Nondairy creamers and whipped toppings. Processed cheese, cheese spreads, or cheese curds. Condiments Onion and garlic salt, seasoned salt, table salt, and sea salt. Canned and packaged gravies. Worcestershire sauce. Tartar sauce. Barbecue sauce. Teriyaki sauce. Soy sauce, including reduced sodium. Steak sauce. Fish sauce. Oyster sauce. Cocktail sauce. Horseradish. Ketchup and mustard. Meat flavorings and tenderizers. Bouillon cubes. Hot sauce. Tabasco sauce. Marinades. Taco seasonings. Relishes. Fats and Oils Butter, stick margarine, lard, shortening, ghee, and bacon fat. Coconut, palm kernel, or palm oils. Regular salad dressings. Other Pickles and olives. Salted popcorn and pretzels. The items listed above may not  be a complete list of foods and beverages to avoid. Contact your dietitian for more information. WHERE CAN I FIND MORE INFORMATION? National Heart, Lung, and Blood Institute: [employer] Document Released: 04/04/2011 Document Revised: 08/30/2013 Document Reviewed: 02/17/2013 [facility] Patient Information 2015 [place], [place]. This information is not intended to replace advice given to you by your health care provider. Make sure you discuss any questions you have with your health care provider.        Why follow it? Research shows. . Those who follow the Mediterranean diet have a reduced risk of heart disease  . The diet is associated with a reduced incidence of Parkinson's and Alzheimer's diseases . People following the diet may have longer life expectancies and lower rates of chronic diseases  . The Dietary Guidelines for Americans recommends the Mediterranean diet as an eating plan to promote health and prevent disease  What Is the Mediterranean Diet?  . Healthy eating plan based on typical foods and recipes of Mediterranean-style cooking . The diet is primarily a plant based diet; these foods should make up a majority of meals   Starches - Plant based foods should make up a majority of meals - They are an important sources of vitamins, minerals, energy, antioxidants, and fiber - Choose whole grains, foods high in fiber and minimally processed items  -  Typical grain sources include wheat, oats, barley, corn, brown rice, bulgar, farro, millet, polenta, couscous  - Various types of beans include chickpeas, lentils, fava beans, black beans, white beans   Fruits  Veggies - Large quantities of antioxidant rich fruits & veggies; 6 or more servings  - Vegetables can be eaten raw or lightly drizzled with oil and cooked  - Vegetables common to the traditional Mediterranean Diet include: artichokes, arugula, beets, broccoli, brussel sprouts, cabbage, carrots,  celery, collard greens, cucumbers, eggplant, kale, leeks, lemons, lettuce, mushrooms, okra, onions, peas, peppers, potatoes, pumpkin, radishes, rutabaga, shallots, spinach, sweet potatoes, turnips, zucchini - Fruits common to the Mediterranean Diet include: apples, apricots, avocados, cherries, clementines, dates, figs, grapefruits, grapes, melons, nectarines, oranges, peaches, pears, pomegranates, strawberries, tangerines  Fats - Replace butter and margarine with healthy oils, such as olive oil, canola oil, and tahini  - Limit nuts to no more than a handful a day  - Nuts include walnuts, almonds, pecans, pistachios, pine nuts  - Limit or avoid candied, honey roasted or heavily salted nuts - Olives are central to the [employer] - can be eaten whole or used in a variety of dishes   Meats Protein - Limiting red meat: no more than a few times a month - When eating red meat: choose lean cuts and keep the portion to the size of deck of cards - Eggs: approx. 0 to 4 times a week  - Fish and lean poultry: at least 2 a week  - Healthy protein sources include, chicken, [place], lean beef, lamb - Increase intake of seafood such as tuna, salmon, trout, mackerel, shrimp, scallops - Avoid or limit high fat processed meats such as sausage and bacon  Dairy - Include moderate amounts of low fat dairy products  - Focus on healthy dairy such as fat free yogurt, skim milk, low or reduced fat cheese - Limit dairy products higher in fat such as whole or 2% milk, cheese, ice cream  Alcohol - Moderate amounts of red wine is ok  - No more than 5 oz daily for women (all ages) and men older than age 24  - No more than 10 oz of wine daily for men younger than 44  Other - Limit sweets and other desserts  - Use herbs and spices instead of salt to flavor foods  - Herbs and spices common to the traditional Mediterranean Diet include: basil, bay leaves, chives, cloves, cumin, fennel, garlic, lavender, marjoram, mint,  oregano, parsley, pepper, rosemary, sage, savory, sumac, tarragon, thyme   It's not just a diet, it's a lifestyle:  . The Mediterranean diet includes lifestyle factors typical of those in the region  . Foods, drinks and meals are best eaten with others and savored . Daily physical activity is important for overall good health . This could be strenuous exercise like running and aerobics . This could also be more leisurely activities such as walking, housework, yard-work, or taking the stairs . Moderation is the key; a balanced and healthy diet accommodates most foods and drinks . Consider portion sizes and frequency of consumption of certain foods   Meal Ideas & Options:  . Breakfast:  o Whole wheat toast or whole wheat English muffins with peanut butter & hard boiled egg o Steel cut oats topped with apples & cinnamon and skim milk  o Fresh fruit: banana, strawberries, melon, berries, peaches  o Smoothies: strawberries, bananas, greek yogurt, peanut butter o Low fat greek yogurt with blueberries and granola  o Egg white omelet with spinach and mushrooms o Breakfast couscous: whole wheat couscous, apricots, skim milk, cranberries  . Sandwiches:  o Hummus and grilled vegetables (peppers, zucchini, squash) on whole wheat bread   o Grilled chicken on whole wheat pita with lettuce, tomatoes, cucumbers or tzatziki  o Tuna salad on whole wheat bread: tuna salad made with greek yogurt, olives, red peppers, capers, green onions o Garlic rosemary lamb pita: lamb sauted with garlic, rosemary, salt & pepper; add lettuce, cucumber, greek yogurt to pita - flavor with lemon juice and black pepper  . Seafood:  o Mediterranean grilled salmon, seasoned with garlic, basil, parsley, lemon juice and black pepper o Shrimp, lemon, and spinach whole-grain pasta salad made with low fat greek yogurt  o Seared scallops with lemon orzo  o Seared tuna steaks seasoned salt, pepper, coriander topped with tomato  mixture of olives, tomatoes, olive oil, minced garlic, parsley, green onions and cappers  . Meats:  o Herbed greek chicken salad with kalamata olives, cucumber, feta  o Red bell peppers stuffed with spinach, bulgur, lean ground beef (or lentils) & topped with feta   o Kebabs: skewers of chicken, tomatoes, onions, zucchini, squash  o [place] burgers: made with red onions, mint, dill, lemon juice, feta cheese topped with roasted red peppers . Vegetarian o Cucumber salad: cucumbers, artichoke hearts, celery, red onion, feta cheese, tossed in olive oil & lemon juice  o Hummus and whole grain pita points with a greek salad (lettuce, tomato, feta, olives, cucumbers, red onion) o Lentil soup with celery, carrots made with vegetable broth, garlic, salt and pepper  o Tabouli salad: parsley, bulgur, mint, scallions, cucumbers, tomato, radishes, lemon juice, olive oil, salt and pepper.     DASH Eating Plan DASH stands for "Dietary Approaches to Stop Hypertension." The DASH eating plan is a healthy eating plan that has been shown to reduce high blood pressure (hypertension). Additional health benefits may include reducing the risk of type 2 diabetes mellitus, heart disease, and stroke. The DASH eating plan may also help with weight loss. WHAT DO I NEED TO KNOW ABOUT THE DASH EATING PLAN? For the DASH eating plan, you will follow these general guidelines:  Choose foods with a percent daily value for sodium of less than 5% (as listed on the food label).  Use salt-free seasonings or herbs instead of table salt or sea salt.  Check with your health care provider or pharmacist before using salt substitutes.  Eat lower-sodium products, often labeled as "lower sodium" or "no salt added."  Eat fresh foods.  Eat more vegetables, fruits, and low-fat dairy products.  Choose whole grains. Look for the word "whole" as the first word in the ingredient list.  Choose fish and skinless chicken or [place] more often  than red meat. Limit fish, poultry, and meat to 6 oz (170 g) each day.  Limit sweets, desserts, sugars, and sugary drinks.  Choose heart-healthy fats.  Limit cheese to 1 oz (28 g) per day.  Eat more home-cooked food and less restaurant, buffet, and fast food.  Limit fried foods.  Cook foods using methods other than frying.  Limit canned vegetables. If you do use them, rinse them well to decrease the sodium.  When eating at a restaurant, ask that your food be prepared with less salt, or no salt if possible. WHAT FOODS CAN I EAT? Seek help from a dietitian for individual calorie needs. Grains Whole grain or whole wheat bread. Brown rice. Whole grain  or whole wheat pasta. Quinoa, bulgur, and whole grain cereals. Low-sodium cereals. Corn or whole wheat flour tortillas. Whole grain cornbread. Whole grain crackers. Low-sodium crackers. Vegetables Fresh or frozen vegetables (raw, steamed, roasted, or grilled). Low-sodium or reduced-sodium tomato and vegetable juices. Low-sodium or reduced-sodium tomato sauce and paste. Low-sodium or reduced-sodium canned vegetables.  Fruits All fresh, canned (in natural juice), or frozen fruits. Meat and Other Protein Products Ground beef (85% or leaner), grass-fed beef, or beef trimmed of fat. Skinless chicken or [place]. Ground chicken or [place]. Pork trimmed of fat. All fish and seafood. Eggs. Dried beans, peas, or lentils. Unsalted nuts and seeds. Unsalted canned beans. Dairy Low-fat dairy products, such as skim or 1% milk, 2% or reduced-fat cheeses, low-fat ricotta or cottage cheese, or plain low-fat yogurt. Low-sodium or reduced-sodium cheeses. Fats and Oils Tub margarines without trans fats. Light or reduced-fat mayonnaise and salad dressings (reduced sodium). Avocado. Safflower, olive, or canola oils. Natural peanut or almond butter. Other Unsalted popcorn and pretzels. The items listed above may not be a complete list of recommended foods or  beverages. Contact your dietitian for more options. WHAT FOODS ARE NOT RECOMMENDED? Grains White bread. White pasta. White rice. Refined cornbread. Bagels and croissants. Crackers that contain trans fat. Vegetables Creamed or fried vegetables. Vegetables in a cheese sauce. Regular canned vegetables. Regular canned tomato sauce and paste. Regular tomato and vegetable juices. Fruits Dried fruits. Canned fruit in light or heavy syrup. Fruit juice. Meat and Other Protein Products Fatty cuts of meat. Ribs, chicken wings, bacon, sausage, bologna, salami, chitterlings, fatback, hot dogs, bratwurst, and packaged luncheon meats. Salted nuts and seeds. Canned beans with salt. Dairy Whole or 2% milk, cream, half-and-half, and cream cheese. Whole-fat or sweetened yogurt. Full-fat cheeses or blue cheese. Nondairy creamers and whipped toppings. Processed cheese, cheese spreads, or cheese curds. Condiments Onion and garlic salt, seasoned salt, table salt, and sea salt. Canned and packaged gravies. Worcestershire sauce. Tartar sauce. Barbecue sauce. Teriyaki sauce. Soy sauce, including reduced sodium. Steak sauce. Fish sauce. Oyster sauce. Cocktail sauce. Horseradish. Ketchup and mustard. Meat flavorings and tenderizers. Bouillon cubes. Hot sauce. Tabasco sauce. Marinades. Taco seasonings. Relishes. Fats and Oils Butter, stick margarine, lard, shortening, ghee, and bacon fat. Coconut, palm kernel, or palm oils. Regular salad dressings. Other Pickles and olives. Salted popcorn and pretzels. The items listed above may not be a complete list of foods and beverages to avoid. Contact your dietitian for more information. WHERE CAN I FIND MORE INFORMATION? National Heart, Lung, and Blood Institute: [employer] Document Released: 04/04/2011 Document Revised: 08/30/2013 Document Reviewed: 02/17/2013 [facility] Patient Information 2015 [place], [place]. This information is not  intended to replace advice given to you by your health care provider. Make sure you discuss any questions you have with your health care provider.        Why follow it? Research shows. . Those who follow the Mediterranean diet have a reduced risk of heart disease  . The diet is associated with a reduced incidence of Parkinson's and Alzheimer's diseases . People following the diet may have longer life expectancies and lower rates of chronic diseases  . The Dietary Guidelines for Americans recommends the Mediterranean diet as an eating plan to promote health and prevent disease  What Is the Mediterranean Diet?  . Healthy eating plan based on typical foods and recipes of Mediterranean-style cooking . The diet is primarily a plant based diet; these foods should make up a majority of meals   Starches -  Plant based foods should make up a majority of meals - They are an important sources of vitamins, minerals, energy, antioxidants, and fiber - Choose whole grains, foods high in fiber and minimally processed items  - Typical grain sources include wheat, oats, barley, corn, brown rice, bulgar, farro, millet, polenta, couscous  - Various types of beans include chickpeas, lentils, fava beans, black beans, white beans   Fruits  Veggies - Large quantities of antioxidant rich fruits & veggies; 6 or more servings  - Vegetables can be eaten raw or lightly drizzled with oil and cooked  - Vegetables common to the traditional Mediterranean Diet include: artichokes, arugula, beets, broccoli, brussel sprouts, cabbage, carrots, celery, collard greens, cucumbers, eggplant, kale, leeks, lemons, lettuce, mushrooms, okra, onions, peas, peppers, potatoes, pumpkin, radishes, rutabaga, shallots, spinach, sweet potatoes, turnips, zucchini - Fruits common to the Mediterranean Diet include: apples, apricots, avocados, cherries, clementines, dates, figs, grapefruits, grapes, melons, nectarines, oranges, peaches, pears,  pomegranates, strawberries, tangerines  Fats - Replace butter and margarine with healthy oils, such as olive oil, canola oil, and tahini  - Limit nuts to no more than a handful a day  - Nuts include walnuts, almonds, pecans, pistachios, pine nuts  - Limit or avoid candied, honey roasted or heavily salted nuts - Olives are central to the [employer] - can be eaten whole or used in a variety of dishes   Meats Protein - Limiting red meat: no more than a few times a month - When eating red meat: choose lean cuts and keep the portion to the size of deck of cards - Eggs: approx. 0 to 4 times a week  - Fish and lean poultry: at least 2 a week  - Healthy protein sources include, chicken, [place], lean beef, lamb - Increase intake of seafood such as tuna, salmon, trout, mackerel, shrimp, scallops - Avoid or limit high fat processed meats such as sausage and bacon  Dairy - Include moderate amounts of low fat dairy products  - Focus on healthy dairy such as fat free yogurt, skim milk, low or reduced fat cheese - Limit dairy products higher in fat such as whole or 2% milk, cheese, ice cream  Alcohol - Moderate amounts of red wine is ok  - No more than 5 oz daily for women (all ages) and men older than age 38  - No more than 10 oz of wine daily for men younger than 66  Other - Limit sweets and other desserts  - Use herbs and spices instead of salt to flavor foods  - Herbs and spices common to the traditional Mediterranean Diet include: basil, bay leaves, chives, cloves, cumin, fennel, garlic, lavender, marjoram, mint, oregano, parsley, pepper, rosemary, sage, savory, sumac, tarragon, thyme   It's not just a diet, it's a lifestyle:  . The Mediterranean diet includes lifestyle factors typical of those in the region  . Foods, drinks and meals are best eaten with others and savored . Daily physical activity is important for overall good health . This could be strenuous exercise like running and  aerobics . This could also be more leisurely activities such as walking, housework, yard-work, or taking the stairs . Moderation is the key; a balanced and healthy diet accommodates most foods and drinks . Consider portion sizes and frequency of consumption of certain foods   Meal Ideas & Options:  . Breakfast:  o Whole wheat toast or whole wheat English muffins with peanut butter & hard boiled egg o Steel  cut oats topped with apples & cinnamon and skim milk  o Fresh fruit: banana, strawberries, melon, berries, peaches  o Smoothies: strawberries, bananas, greek yogurt, peanut butter o Low fat greek yogurt with blueberries and granola  o Egg white omelet with spinach and mushrooms o Breakfast couscous: whole wheat couscous, apricots, skim milk, cranberries  . Sandwiches:  o Hummus and grilled vegetables (peppers, zucchini, squash) on whole wheat bread   o Grilled chicken on whole wheat pita with lettuce, tomatoes, cucumbers or tzatziki  o Tuna salad on whole wheat bread: tuna salad made with greek yogurt, olives, red peppers, capers, green onions o Garlic rosemary lamb pita: lamb sauted with garlic, rosemary, salt & pepper; add lettuce, cucumber, greek yogurt to pita - flavor with lemon juice and black pepper  . Seafood:  o Mediterranean grilled salmon, seasoned with garlic, basil, parsley, lemon juice and black pepper o Shrimp, lemon, and spinach whole-grain pasta salad made with low fat greek yogurt  o Seared scallops with lemon orzo  o Seared tuna steaks seasoned salt, pepper, coriander topped with tomato mixture of olives, tomatoes, olive oil, minced garlic, parsley, green onions and cappers  . Meats:  o Herbed greek chicken salad with kalamata olives, cucumber, feta  o Red bell peppers stuffed with spinach, bulgur, lean ground beef (or lentils) & topped with feta   o Kebabs: skewers of chicken, tomatoes, onions, zucchini, squash  o [place] burgers: made with red onions, mint, dill,  lemon juice, feta cheese topped with roasted red peppers . Vegetarian o Cucumber salad: cucumbers, artichoke hearts, celery, red onion, feta cheese, tossed in olive oil & lemon juice  o Hummus and whole grain pita points with a greek salad (lettuce, tomato, feta, olives, cucumbers, red onion) o Lentil soup with celery, carrots made with vegetable broth, garlic, salt and pepper  o Tabouli salad: parsley, bulgur, mint, scallions, cucumbers, tomato, radishes, lemon juice, olive oil, salt and pepper.     Inguinal Hernia, Adult Muscles help keep everything in the body in its proper place. But if a weak spot in the muscles develops, something can poke through. That is called a hernia. When this happens in the lower part of the belly (abdomen), it is called an inguinal hernia. (It takes its name from a part of the body in this region called the inguinal canal.) A weak spot in the wall of muscles lets some fat or part of the small intestine bulge through. An inguinal hernia can develop at any age. Men get them more often than women. CAUSES  In adults, an inguinal hernia develops over time.  It can be triggered by:  Suddenly straining the muscles of the lower abdomen.  Lifting heavy objects.  Straining to have a bowel movement. Difficult bowel movements (constipation) can lead to this.  Constant coughing. This may be caused by smoking or lung disease.  Being overweight.  Being pregnant.  Working at a job that requires long periods of standing or heavy lifting.  Having had an inguinal hernia before. One type can be an emergency situation. It is called a strangulated inguinal hernia. It develops if part of the small intestine slips through the weak spot and cannot get back into the abdomen. The blood supply can be cut off. If that happens, part of the intestine may die. This situation requires emergency surgery. SYMPTOMS  Often, a small inguinal hernia has no symptoms. It is found when a  healthcare provider does a physical exam. Larger hernias usually  have symptoms.   In adults, symptoms may include:  A lump in the groin. This is easier to see when the person is standing. It might disappear when lying down.  In men, a lump in the scrotum.  Pain or burning in the groin. This occurs especially when lifting, straining or coughing.  A dull ache or feeling of pressure in the groin.  Signs of a strangulated hernia can include:  A bulge in the groin that becomes very painful and tender to the touch.  A bulge that turns red or purple.  Fever, nausea and vomiting.  Inability to have a bowel movement or to pass gas. DIAGNOSIS  To decide if you have an inguinal hernia, a healthcare provider will probably do a physical examination.  This will include asking questions about any symptoms you have noticed.  The healthcare provider might feel the groin area and ask you to cough. If an inguinal hernia is felt, the healthcare provider may try to slide it back into the abdomen.  Usually no other tests are needed. TREATMENT  Treatments can vary. The size of the hernia makes a difference. Options include:  Watchful waiting. This is often suggested if the hernia is small and you have had no symptoms.  No medical procedure will be done unless symptoms develop.  You will need to watch closely for symptoms. If any occur, contact your healthcare provider right away.  Surgery. This is used if the hernia is larger or you have symptoms.  Open surgery. This is usually an outpatient procedure (you will not stay overnight in a hospital). An cut (incision) is made through the skin in the groin. The hernia is put back inside the abdomen. The weak area in the muscles is then repaired by herniorrhaphy or hernioplasty. Herniorrhaphy: in this type of surgery, the weak muscles are sewn back together. Hernioplasty: a patch or mesh is used to close the weak area in the abdominal wall.  Laparoscopy.  In this procedure, a surgeon makes small incisions. A thin tube with a tiny video camera (called a laparoscope) is put into the abdomen. The surgeon repairs the hernia with mesh by looking with the video camera and using two long instruments. HOME CARE INSTRUCTIONS   After surgery to repair an inguinal hernia:  You will need to take pain medicine prescribed by your healthcare provider. Follow all directions carefully.  You will need to take care of the wound from the incision.  Your activity will be restricted for awhile. This will probably include no heavy lifting for several weeks. You also should not do anything too active for a few weeks. When you can return to work will depend on the type of job that you have.  During "watchful waiting" periods, you should:  Maintain a healthy weight.  Eat a diet high in fiber (fruits, vegetables and whole grains).  Drink plenty of fluids to avoid constipation. This means drinking enough water and other liquids to keep your urine clear or pale yellow.  Do not lift heavy objects.  Do not stand for long periods of time.  Quit smoking. This should keep you from developing a frequent cough. SEEK MEDICAL CARE IF:   A bulge develops in your groin area.  You feel pain, a burning sensation or pressure in the groin. This might be worse if you are lifting or straining.  You develop a fever of more than 100.5 F (38.1 C). SEEK IMMEDIATE MEDICAL CARE IF:   Pain in the  groin increases suddenly.  A bulge in the groin gets bigger suddenly and does not go down.  For men, there is sudden pain in the scrotum. Or, the size of the scrotum increases.  A bulge in the groin area becomes red or purple and is painful to touch.  You have nausea or vomiting that does not go away.  You feel your heart beating much faster than normal.  You cannot have a bowel movement or pass gas.  You develop a fever of more than 102.0 F (38.9 C).   This information is  not intended to replace advice given to you by your health care provider. Make sure you discuss any questions you have with your health care provider.   Document Released: 09/01/2008 Document Revised: 07/08/2011 Document Reviewed: 10/17/2014 Elsevier Interactive Patient Education [employer]

## 2015-05-18 NOTE — Progress Notes (Signed)
Subjective:   HPI  Carl Wallace is a 40 y.o. male who presents for a complete physical.  Doing well, no concerns. Has baby due in 6 wks.  Reviewed their medical, surgical, family, social, medication, and allergy history and updated chart as appropriate.  Past Medical History  Diagnosis Date  . Childhood asthma   . Allergic rhinitis   . Wears glasses     alternates between glasses and contacts  . Genital herpes   . Knee pain, bilateral   . Low back pain   . Knee injury     left knee remote past  . H/O testicular mass     evaluatd by Urology 2006  . Left knee injury     remote past with muscle tear  . Hypertension 04/2015    Past Surgical History  Procedure Laterality Date  . No past surgeries      Social History   Social History  . Marital Status: Married    Spouse Name: N/A  . Number of Children: N/A  . Years of Education: N/A   Occupational History  . Not on file.   Social History Main Topics  . Smoking status: Never Smoker   . Smokeless tobacco: Not on file  . Alcohol Use: 0.6 oz/week    1 Cans of beer per week  . Drug Use: No  . Sexual Activity: Not on file   Other Topics Concern  . Not on file   Social History Narrative   Married, has 2 children, 6yo and 8yo, new baby on the way, exercise some with walking, basetball. Non smoker, christian.  Production designer, theatre/television/film at [employer] in [place], [place].  Son is in Doctor, general practice, daughter likes art.  As of 04/2015    Family History  Problem Relation Age of Onset  . Hypertension Mother   . Hypertension Brother   . Asthma Daughter   . Diabetes Maternal Grandmother   . Hypertension Maternal Grandmother      Current outpatient prescriptions:  .  sildenafil (VIAGRA) 50 MG tablet, 1/2 tablet po prn daily (Patient not taking: Reported on 05/18/2015), Disp: 3 tablet, Rfl: 0 .  valACYclovir (VALTREX) 500 MG tablet, 1 tablet po BID x 3 days for flare up (Patient not taking: Reported on 05/18/2015), Disp: 30 tablet, Rfl: 1  No Known  Allergies   Review of Systems Constitutional: -fever, -chills, -sweats, -unexpected weight change, -decreased appetite, -fatigue Allergy: -sneezing, -itching, -congestion Dermatology: -changing moles, --rash, -lumps ENT: -runny nose, -ear pain, -sore throat, -hoarseness, -sinus pain, -teeth pain, - ringing in ears, -hearing loss, -nosebleeds Cardiology: -chest pain, -palpitations, -swelling, -difficulty breathing when lying flat, -waking up short of breath Respiratory: -cough, -shortness of breath, -difficulty breathing with exercise or exertion, -wheezing, -coughing up blood Gastroenterology: -abdominal pain, -nausea, -vomiting, -diarrhea, -constipation, -blood in stool, -changes in bowel movement, -difficulty swallowing or eating Hematology: -bleeding, -bruising  Musculoskeletal: -joint aches(Knee), -muscle aches, -joint swelling, +back pain, -neck pain, -cramping, -changes in gait Ophthalmology: denies vision changes, eye redness, itching, discharge Urology: -burning with urination, -difficulty urinating, -blood in urine, -urinary frequency, -urgency, -incontinence Neurology: -headache, -weakness, -tingling, -numbness, -memory loss, -falls, -dizziness Psychology: -depressed mood, -agitation, -sleep problems     Objective:   Physical Exam  BP 128/94 mmHg  Pulse 74  Ht  (1.956 m)  Wt 258 lb (117.028 kg)  BMI 30.59 kg/m2  General appearance: alert, no distress, WD/WN,  Skin: Right arm with small brown 3 mm diameter flat round macule of right forearm  volar surface, right lateral elbow with 2 mm raised brown papule unchanged for years, tattoo right upper arm, no worrisome lesions HEENT: normocephalic, conjunctiva/corneas normal, sclerae anicteric, PERRLA, EOMi, nares patent, no discharge or erythema, pharynx normal Oral cavity: MMM, tongue normal, teeth in good repair, has clear braces present Neck: supple, no lymphadenopathy, no thyromegaly, no masses, normal ROM, no  bruits Chest: non tender, normal shape and expansion Heart: RRR, normal S1, S2, no murmurs Lungs: CTA bilaterally, no wheezes, rhonchi, or rales Abdomen: +bs, soft, non tender, non distended, no masses, no hepatomegaly, no splenomegaly, no bruits Back: non tender, normal ROM, no scoliosis Musculoskeletal: upper extremities non tender, no obvious deformity, normal ROM throughout, lower extremities non tender, no obvious deformity, normal ROM throughout Extremities: no edema, no cyanosis, no clubbing Pulses: 2+ symmetric, upper and lower extremities, normal cap refill Neurological: alert, oriented x 3, CN2-12 intact, strength normal upper extremities and lower extremities, sensation normal throughout, DTRs 2+ throughout, no cerebellar signs, gait normal Psychiatric: normal affect, behavior normal, pleasant  GU: normal male external genitalia, circumcised , nontender, no masses, small left inguinal hernia reducible, no lymphadenopathy Rectal: deferred   Assessment and Plan :    Encounter Diagnoses  Name Primary?  . Encounter for health maintenance examination in adult Yes  . Essential hypertension   . Unilateral inguinal hernia without obstruction or gangrene, recurrence not specified   . Need for prophylactic vaccination and inoculation against influenza   . Adult BMI 30+     Physical exam - discussed healthy lifestyle, diet, exercise, preventative care, vaccinations, and addressed their concerns.   Routine labs today Counseled on the influenza virus vaccine.  Vaccine information sheet given.  Influenza vaccine given after consent obtained. See your eye doctor yearly for routine vision care. See your dentist yearly for routine dental care including hygiene visits twice yearly. Hypertension: new onset, borderline last year.   He wants to use lifestyle measures for now.  Declines medication.   discussed diagnosis, possible complications, goals.   Advised monitoring BP.   F/u 3-85mo. Left  inguinal hernia - discussed findings, possible surgical intervention, symptom or signs that would prompt urgent eval.  We will use a watch and wait approach Follow-up pending labs

## 2015-05-19 LAB — HEMOGLOBIN A1C
Hgb A1c MFr Bld: 5.9 % — ABNORMAL HIGH (ref <5.7)
Mean Plasma Glucose: 123 mg/dL — ABNORMAL HIGH (ref <117)

## 2015-12-24 IMAGING — CR DG KNEE COMPLETE 4+V*R*
1 series · 1 of 1 positions shown · non-contrast
Comparison: Left knee series of today's date

CLINICAL DATA: Several year history of bilateral knee pain with
increased symptoms recently. No known injury

EXAM:
RIGHT KNEE - COMPLETE 4+ VIEW

[view not recorded]
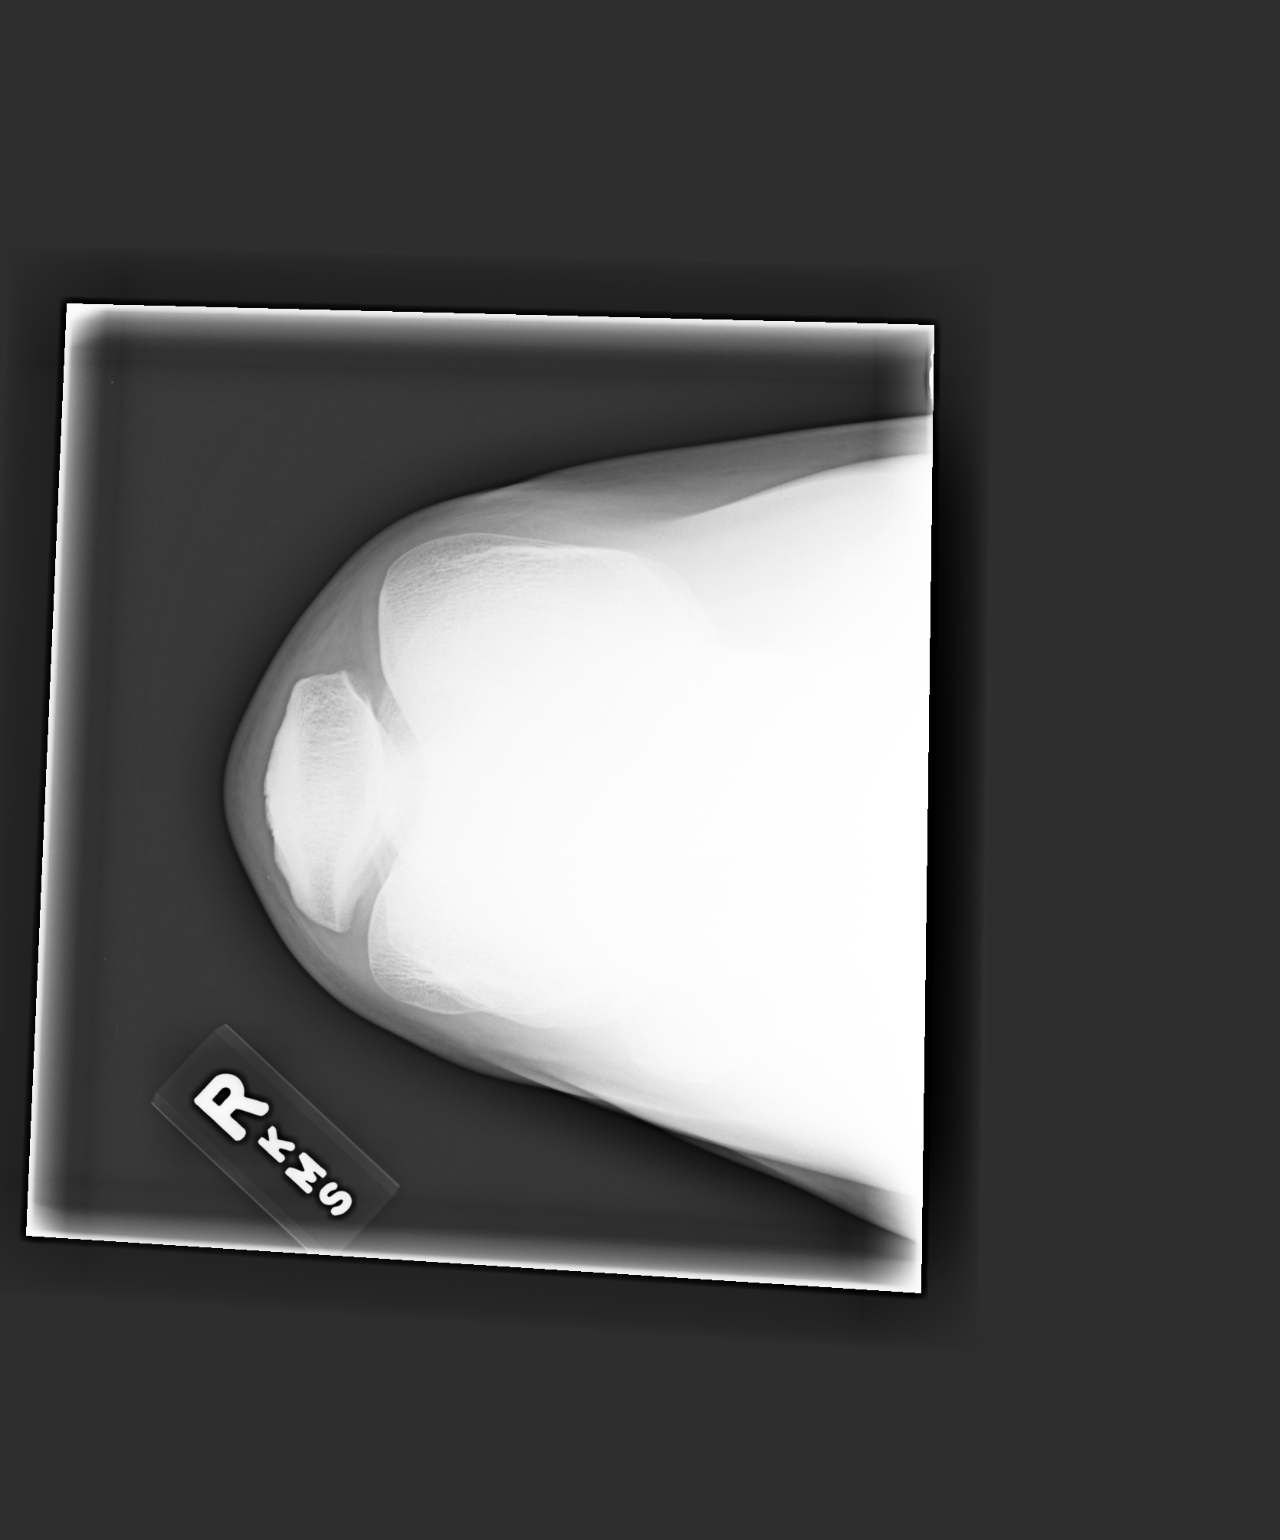

[1 of 1 positions shown; findings below may reference images not displayed]

FINDINGS: The bones of the right knee are adequately mineralized. The joint
spaces are preserved. There is beaking of the tibial spines. There
is a tiny spur from the medial aspect of the lateral femoral
condyles. There are tiny spurs from the superior and inferior
articular margins of the patella. There is lucency through the
inferior pole of the patella which is likely developmental but
correlation with any history of direct trauma here is needed. There
is no joint effusion or chondrocalcinosis.
IMPRESSION: There mild osteoarthritic changes of the right knee. There is no
acute bony abnormality.

## 2016-10-02 ENCOUNTER — Telehealth: Payer: Self-pay

## 2016-10-02 NOTE — Telephone Encounter (Signed)
[facility] Records faxed to [phone].

## 2016-10-09 ENCOUNTER — Encounter: Payer: Self-pay | Admitting: Medical

## 2016-10-09 ENCOUNTER — Ambulatory Visit (INDEPENDENT_AMBULATORY_CARE_PROVIDER_SITE_OTHER): Payer: BLUE CROSS/BLUE SHIELD | Admitting: Medical

## 2016-10-09 VITALS — BP 124/76 | HR 90 | Ht 77.0 in | Wt 245.0 lb

## 2016-10-09 DIAGNOSIS — Z8042 Family history of malignant neoplasm of prostate: Secondary | ICD-10-CM | POA: Diagnosis not present

## 2016-10-09 DIAGNOSIS — K4020 Bilateral inguinal hernia, without obstruction or gangrene, not specified as recurrent: Secondary | ICD-10-CM | POA: Diagnosis not present

## 2016-10-09 DIAGNOSIS — Z125 Encounter for screening for malignant neoplasm of prostate: Secondary | ICD-10-CM | POA: Diagnosis not present

## 2016-10-09 DIAGNOSIS — Z0000 Encounter for general adult medical examination without abnormal findings: Secondary | ICD-10-CM

## 2016-10-09 LAB — CBC
HCT: 42.2 % (ref 38.5–50.0)
Hemoglobin: 13.8 g/dL (ref 13.2–17.1)
MCH: 27.2 pg (ref 27.0–33.0)
MCHC: 32.7 g/dL (ref 32.0–36.0)
MCV: 83.1 fL (ref 80.0–100.0)
MPV: 10.5 fL (ref 7.5–12.5)
Platelets: 230 10*3/uL (ref 140–400)
RBC: 5.08 MIL/uL (ref 4.20–5.80)
RDW: 13.5 % (ref 11.0–15.0)
WBC: 9.2 10*3/uL (ref 4.0–10.5)

## 2016-10-09 LAB — POCT URINALYSIS DIP (PROADVANTAGE DEVICE)
Bilirubin, UA: NEGATIVE
Blood, UA: NEGATIVE
Glucose, UA: NEGATIVE mg/dL
Ketones, POC UA: NEGATIVE mg/dL
LEUKOCYTES UA: NEGATIVE
NITRITE UA: NEGATIVE
Protein Ur, POC: NEGATIVE mg/dL
Specific Gravity, Urine: 1.02
UUROB: POSITIVE
pH, UA: 6 (ref 5.0–8.0)

## 2016-10-09 LAB — LIPID PANEL
CHOLESTEROL: 139 mg/dL (ref <200)
HDL: 47 mg/dL (ref >40)
LDL Cholesterol: 79 mg/dL (ref <100)
TRIGLYCERIDES: 66 mg/dL (ref <150)
Total CHOL/HDL Ratio: 3 Ratio (ref <5.0)
VLDL: 13 mg/dL (ref <30)

## 2016-10-09 LAB — COMPREHENSIVE METABOLIC PANEL
ALBUMIN: 4.3 g/dL (ref 3.6–5.1)
ALT: 12 U/L (ref 9–46)
AST: 16 U/L (ref 10–40)
Alkaline Phosphatase: 47 U/L (ref 40–115)
BUN: 12 mg/dL (ref 7–25)
CO2: 27 mmol/L (ref 20–31)
Calcium: 9.1 mg/dL (ref 8.6–10.3)
Chloride: 103 mmol/L (ref 98–110)
Creat: 1.11 mg/dL (ref 0.60–1.35)
Glucose, Bld: 80 mg/dL (ref 65–99)
Potassium: 4 mmol/L (ref 3.5–5.3)
SODIUM: 141 mmol/L (ref 135–146)
TOTAL PROTEIN: 6.6 g/dL (ref 6.1–8.1)
Total Bilirubin: 1.2 mg/dL (ref 0.2–1.2)

## 2016-10-09 NOTE — Progress Notes (Signed)
Subjective:   HPI  Carl Wallace is a 41 y.o. male who presents for a complete physical.  Doing well, no concerns.  Moved to new house in [place] recently.  Exercise - more often than in the past.   Has lost 25lb total since last visit, been eating healthy.  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   Also moved in March so this was good for weight loss and exercise as well.   Reviewed their medical, surgical, family, social, medication, and allergy history and updated chart as appropriate.  Past Medical History:  Diagnosis Date  . Allergic rhinitis   . Childhood asthma   . Genital herpes   . H/O testicular mass    evaluatd by Urology 2006  . Hypertension 04/2015  . Knee injury    left knee remote past  . Knee pain, bilateral   . Left knee injury    remote past with muscle tear  . Low back pain   . Wears glasses    alternates between glasses and contacts    Past Surgical History:  Procedure Laterality Date  . NO PAST SURGERIES      Social History   Social History  . Marital status: Married    Spouse name: N/A  . Number of children: N/A  . Years of education: N/A   Occupational History  . Not on file.   Social History Main Topics  . Smoking status: Never Smoker  . Smokeless tobacco: Never Used  . Alcohol use 1.8 oz/week    1 Cans of beer, 1 Glasses of wine, 1 Shots of liquor per week     Comment: occ  . Drug use: No  . Sexual activity: Not on file   Other Topics Concern  . Not on file   Social History Narrative   Married, has 3 children, 7yo and 9yo, 85mo, exercise some with aerobic and weights. Coaches basketball and football. Non smoker, christian.  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  Son is in karate, daughter likes art.  09/2016    Family History  Problem Relation Age of Onset  . Hypertension Mother   . Hypertension Brother   . Asthma Daughter   . Diabetes Maternal Grandmother   . Hypertension  Maternal Grandmother   . Cancer Maternal Uncle        prostate     Current Outpatient Prescriptions:  .  valACYclovir (VALTREX) 500 MG tablet, 1 tablet po BID x 3 days for flare up, Disp: 30 tablet, Rfl: 1  No Known Allergies   Review of Systems Constitutional: -fever, -chills, -sweats, -unexpected weight change, -decreased appetite, -fatigue Allergy: -sneezing, -itching, -congestion Dermatology: -changing moles, --rash, -lumps ENT: -runny nose, -ear pain, -sore throat, -hoarseness, -sinus pain, -teeth pain, - ringing in ears, -hearing loss, -nosebleeds Cardiology: -chest pain, -palpitations, -swelling, -difficulty breathing when lying flat, -waking up short of breath Respiratory: -cough, -shortness of breath, -difficulty breathing with exercise or exertion, -wheezing, -coughing up blood Gastroenterology: -abdominal pain, -nausea, -vomiting, -diarrhea, -constipation, -blood in stool, -changes in bowel movement, -difficulty swallowing or eating Hematology: -bleeding, -bruising  Musculoskeletal: -joint aches(Knee), -muscle aches, -joint swelling, +back pain, -neck pain, -cramping, -changes in gait Ophthalmology: denies vision changes, eye redness, itching, discharge Urology: -burning with urination, -difficulty urinating, -blood in urine, -urinary frequency, -urgency, -incontinence Neurology: -headache, -weakness, -tingling, -numbness, -memory loss, -falls, -dizziness Psychology: -depressed mood, -agitation, -sleep problems     Objective:   Physical Exam  BP 124/76   Pulse 90   Ht 6\' 5"  (1.956 m)   Wt 245 lb (111.1 kg)   SpO2 97%   BMI 29.05 kg/m   General appearance: alert, no distress, WD/WN, AA male Skin: Right arm with small brown 3 mm diameter flat round macule of right forearm volar surface, right lateral elbow with 2 mm raised brown papule unchanged for years, tattoo right upper arm, no worrisome lesions HEENT: normocephalic, conjunctiva/corneas normal, sclerae  anicteric, PERRLA, EOMi, nares patent, no discharge or erythema, pharynx normal Oral cavity: MMM, tongue normal, teeth in good repair, has clear braces present Neck: supple, no lymphadenopathy, no thyromegaly, no masses, normal ROM, no bruits Chest: non tender, normal shape and expansion Heart: RRR, normal S1, S2, no murmurs Lungs: CTA bilaterally, no wheezes, rhonchi, or rales Abdomen: +bs, soft, non tender, non distended, no masses, no hepatomegaly, no splenomegaly, no bruits Back: non tender, normal ROM, no scoliosis Musculoskeletal: upper extremities non tender, no obvious deformity, normal ROM throughout, lower extremities non tender, no obvious deformity, normal ROM throughout Extremities: no edema, no cyanosis, no clubbing Pulses: 2+ symmetric, upper and lower extremities, normal cap refill Neurological: alert, oriented x 3, CN2-12 intact, strength normal upper extremities and lower extremities, sensation normal throughout, DTRs 2+ throughout, no cerebellar signs, gait normal Psychiatric: normal affect, behavior normal, pleasant  GU: normal male external genitalia, circumcised , nontender, left upper scrotal mass, 1-1.5cm diameter, unchanged per patient for years, 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  . Routine general medical examination at a health care facility Yes  . Bilateral inguinal hernia without obstruction or gangrene, recurrence not specified   . Encounter for health maintenance examination in adult   . Screening for prostate cancer   . Family history of prostate cancer     Physical exam - discussed healthy lifestyle, diet, exercise, preventative care, vaccinations, and addressed their concerns.   Routine labs today See your eye doctor yearly for routine vision care. See your dentist yearly for routine dental care including  hygiene visits twice yearly. Discussed monthly testicular exams advised flu shot in the fall Left inguinal hernia - has worsened in the past year.   discussed findings, possible surgical intervention, symptom or signs that would prompt urgent eval.  We will use a watch and wait approach Also possible new right inguinal hernia.  Watch and wait approach Baseline PSA today Follow-up pending labs  Carl Wallace was seen today for annual exam.  Diagnoses and all orders for this visit:  Routine general medical examination at a health care facility -     POCT Urinalysis DIP (Proadvantage Device) -     Comprehensive metabolic panel -     Lipid panel -     CBC -     PSA  Bilateral inguinal hernia without obstruction or gangrene, recurrence not specified  Encounter for health maintenance examination in adult  Screening for prostate cancer -     PSA  Family history of prostate cancer -     PSA

## 2016-10-10 LAB — PSA: PSA: 1.1 ng/mL (ref <=4.0)

## 2016-11-01 DIAGNOSIS — Z0279 Encounter for issue of other medical certificate: Secondary | ICD-10-CM

## 2019-10-10 DIAGNOSIS — S0101XA Laceration without foreign body of scalp, initial encounter: Secondary | ICD-10-CM | POA: Diagnosis not present

## 2019-10-10 DIAGNOSIS — W06XXXA Fall from bed, initial encounter: Secondary | ICD-10-CM | POA: Diagnosis not present

## 2019-12-13 DIAGNOSIS — Z20822 Contact with and (suspected) exposure to covid-19: Secondary | ICD-10-CM | POA: Diagnosis not present

## 2019-12-13 DIAGNOSIS — J029 Acute pharyngitis, unspecified: Secondary | ICD-10-CM | POA: Diagnosis not present

## 2019-12-13 DIAGNOSIS — R0981 Nasal congestion: Secondary | ICD-10-CM | POA: Diagnosis not present

## 2019-12-13 DIAGNOSIS — R05 Cough: Secondary | ICD-10-CM | POA: Diagnosis not present

## 2019-12-15 DIAGNOSIS — Z1152 Encounter for screening for COVID-19: Secondary | ICD-10-CM | POA: Diagnosis not present

## 2020-09-11 ENCOUNTER — Ambulatory Visit (INDEPENDENT_AMBULATORY_CARE_PROVIDER_SITE_OTHER): Payer: BC Managed Care – PPO | Admitting: Medical

## 2020-09-11 ENCOUNTER — Encounter: Payer: Self-pay | Admitting: Medical

## 2020-09-11 ENCOUNTER — Other Ambulatory Visit: Payer: Self-pay

## 2020-09-11 VITALS — BP 135/85 | HR 89 | Temp 98.8°F | Ht 77.0 in | Wt 264.6 lb

## 2020-09-11 DIAGNOSIS — Z1329 Encounter for screening for other suspected endocrine disorder: Secondary | ICD-10-CM

## 2020-09-11 DIAGNOSIS — R0683 Snoring: Secondary | ICD-10-CM | POA: Diagnosis not present

## 2020-09-11 DIAGNOSIS — R4586 Emotional lability: Secondary | ICD-10-CM

## 2020-09-11 DIAGNOSIS — R030 Elevated blood-pressure reading, without diagnosis of hypertension: Secondary | ICD-10-CM

## 2020-09-11 DIAGNOSIS — K4020 Bilateral inguinal hernia, without obstruction or gangrene, not specified as recurrent: Secondary | ICD-10-CM | POA: Diagnosis not present

## 2020-09-11 DIAGNOSIS — Z130 Encounter for screening for diseases of the blood and blood-forming organs and certain disorders involving the immune mechanism: Secondary | ICD-10-CM

## 2020-09-11 DIAGNOSIS — R5383 Other fatigue: Secondary | ICD-10-CM | POA: Diagnosis not present

## 2020-09-11 DIAGNOSIS — Z566 Other physical and mental strain related to work: Secondary | ICD-10-CM

## 2020-09-11 LAB — BASIC METABOLIC PANEL

## 2020-09-11 LAB — CBC: MCH: 27.2 pg (ref 26.6–33.0)

## 2020-09-11 LAB — TSH

## 2020-09-11 NOTE — Progress Notes (Signed)
Subjective:  Carl Wallace is a 45 y.o. male who presents for Chief Complaint  Patient presents with  . Hypertension  . Groin Swelling     Here for multiple concerns.  Last visit 2019.  He has several concerns.  He used to work for [employer] as a Production designer, theatre/television/film but recently switched jobs.  He now works at [name] for the past 3 months as a Production designer, theatre/television/film.  Things have not been going so well and he feels under a lot of stress  He has a smart watch that records sleep habits, blood pressure and pulse amongst other variables.  He notes that his blood pressures have been running elevated.  We reviewed his watch readings together and his 30-day average of blood pressure is 135/85 although his reading today was little bit more elevated.  He denies chest pain, palpitations, edema.  His home watch sleep data shows that he does not stay a lot of times in deep sleep and sometimes only gets 5-1/2 hours of sleep.  He does note the most of the time he tries to get 7 hours of sleep.  His wife does note that he snores and has been concerned about apnea.  If he is flat on his back it is worse.  Family history includes brother with sleep apnea  He has a history of inguinal hernia and no prior surgery.  Over time the left side has gotten worse.  Has been having some pains in his left inguinal region  He notes a lot of stress at work.  He has even contemplated taking some time off.  He does not necessarily feel depressed but certainly does not feel himself likely  No other aggravating or relieving factors.    No other c/o.  Past Medical History:  Diagnosis Date  . Allergic rhinitis   . Childhood asthma   . Genital herpes   . H/O testicular mass    evaluatd by Urology 2006  . Hypertension 04/2015  . Knee injury    left knee remote past  . Knee pain, bilateral   . Left knee injury    remote past with muscle tear  . Low back pain   . Wears glasses    alternates between glasses and contacts    The  following portions of the patient's history were reviewed and updated as appropriate: allergies, current medications, past family history, past medical history, past social history, past surgical history and problem list.  ROS Otherwise as in subjective above  Objective: BP 135/85   Pulse 89   Temp 98.8 F (37.1 C)   Ht 6\' 5"  (1.956 m)   Wt 264 lb 9.6 oz (120 kg)   SpO2 97%   BMI 31.38 kg/m   General appearance: alert, no distress, well developed, well nourished, African-American male Neck: supple, no lymphadenopathy, no thyromegaly, no masses, no JVD, no bruit Heart: RRR, normal S1, S2, no murmurs Lungs: CTA bilaterally, no wheezes, rhonchi, or rales Pulses: 2+ radial pulses, 2+ pedal pulses, normal cap refill Ext: no edema Neuro: Nonfocal exam Psych calm and pleasant, good eye contact, answers questions appropriately   Assessment: Encounter Diagnoses  Name Primary?  . Fatigue, unspecified type Yes  . Snoring   . Mood change   . Bilateral inguinal hernia without obstruction or gangrene, recurrence not specified   . Work stress   . Screening for thyroid disorder   . Screening for deficiency anemia   . Elevated blood-pressure reading without diagnosis of hypertension  Plan: Fatigue, snoring - we discussed possible causes.  Labs as below today.  Referral for sleep study through snap home study.  We discussed possible sleep apnea diagnosis and symptoms  Mood change, work stress-advised counseling.  He will first look into employee assistance program at work.  We discussed some strategies to deal with stress  Elevated blood pressure without diagnosis of high blood pressure-we discussed his home readings which are borderline.  He will work on limiting salt, trying to get good sleep, exercise and less animal products in diet.  Recheck over the next month  Left inguinal hernia worse than prior in 2018 and symptomatic-referral to general surgery   Carl Wallace was seen today  for hypertension and groin swelling.  Diagnoses and all orders for this visit:  Fatigue, unspecified type -     Basic metabolic panel -     CBC -     TSH -     Home sleep test; Future  Snoring -     Basic metabolic panel -     CBC -     TSH -     Home sleep test; Future  Mood change -     Basic metabolic panel -     CBC -     TSH  Bilateral inguinal hernia without obstruction or gangrene, recurrence not specified -     Ambulatory referral to General Surgery  Work stress  Screening for thyroid disorder  Screening for deficiency anemia  Elevated blood-pressure reading without diagnosis of hypertension    Follow up: Pending labs and sleep study

## 2020-09-12 LAB — BASIC METABOLIC PANEL
BUN/Creatinine Ratio: 11 (ref 9–20)
BUN: 12 mg/dL (ref 6–24)
Creatinine, Ser: 1.14 mg/dL (ref 0.76–1.27)
Glucose: 88 mg/dL (ref 65–99)
Potassium: 4.4 mmol/L (ref 3.5–5.2)
Sodium: 138 mmol/L (ref 134–144)
eGFR: 80 mL/min/{1.73_m2} (ref >59)

## 2020-09-12 LAB — CBC
Hematocrit: 44.3 % (ref 37.5–51.0)
Hemoglobin: 14.3 g/dL (ref 13.0–17.7)
MCHC: 32.3 g/dL (ref 31.5–35.7)
MCV: 84 fL (ref 79–97)
Platelets: 287 10*3/uL (ref 150–450)
RBC: 5.25 x10E6/uL (ref 4.14–5.80)
RDW: 12.2 % (ref 11.6–15.4)
WBC: 8.1 10*3/uL (ref 3.4–10.8)

## 2020-09-12 NOTE — Progress Notes (Signed)
Done

## 2020-10-09 ENCOUNTER — Telehealth: Payer: Self-pay | Admitting: Medical

## 2020-10-09 NOTE — Telephone Encounter (Signed)
Sleep study actually shows only mild sleep apnea thankfully.  Lets get back in for physical or recheck on fatigue and further discussion

## 2020-10-10 NOTE — Telephone Encounter (Signed)
Left detail message on machine for patient and asked patient to call and schedule an appointment.

## 2020-10-17 ENCOUNTER — Ambulatory Visit: Payer: Self-pay | Admitting: Surgery

## 2020-10-17 DIAGNOSIS — Z87898 Personal history of other specified conditions: Secondary | ICD-10-CM | POA: Diagnosis not present

## 2020-10-17 DIAGNOSIS — K4020 Bilateral inguinal hernia, without obstruction or gangrene, not specified as recurrent: Secondary | ICD-10-CM | POA: Diagnosis not present

## 2020-10-17 NOTE — H&P (Signed)
Carl Wallace Appointment: 10/17/2020 8:45 AM Location: [facility] Patient #: [id number] DOB: Mar 19, 1976 Married / Language: English / Race: Black or African American Male  History of Present Illness [name] MD; 10/17/2020 9:10 AM) The patient is a 45 year old male who presents with an inguinal hernia. Note for "Inguinal hernia": ` ` ` Patient sent for surgical consultation at the request of Dr Aleen Campi  Chief Complaint: Worsening left inguinal hernia. ` ` The patient is a pleasant active male.  Works as a Production designer, [name].  He [employer].  He is felt a little bulge in his left groin for many years.  Followed by primary care.  However, his nose since gotten a little larger and more bothersome.  Occasional twinges of pain.  He discuss with his primary care physician on an annual visit.  Surgical consultation offered.  Spent some delectation of possible bilateral inguinal hernias.  Patient never had any bowel surgery.  No diabetes.  Does not smoke.  He is rather physically active and walks several miles without difficulty.  A history of snoring.  Had a sleep study that was noted to be very mild sleep apnea and not warranting any CPAP machine.  No problems with urination or defecation.  No issues with erectile dysfunction that he is aware.  No history of MRSA skin infections.  He misses bowels usually once a day.  He has not had a colonoscopy yet.  (Review of systems as stated in this history (HPI) or in the review of systems.  Otherwise all other 12 point ROS are negative) ` ` ###########################################`  This patient encounter took 25 minutes today to perform the following: obtain history, perform exam, review outside records, interpret tests & imaging, counsel the patient on their diagnosis; and, document this encounter, including findings & plan in the electronic health record (EHR).   Past Surgical History [name], [name]; 10/17/2020 8:50  AM) No pertinent past surgical history   Diagnostic Studies History [name], [name]; 10/17/2020 8:50 AM) Colonoscopy  never  Medication History ([name], [name]; 10/17/2020 8:54 AM) No Current Medications  Medications Reconciled   Social History [name], [name]; 10/17/2020 8:50 AM) Alcohol use  Occasional alcohol use. Caffeine use  Carbonated beverages, Coffee, Tea. No drug use  Tobacco use  Never smoker.  Family History [name], [name]; 10/17/2020 8:50 AM) Hypertension  Brother, Father, Mother, Sister.  Other Problems [name], [name]; 10/17/2020 8:50 AM) Asthma      Review of Systems ([name]; 10/17/2020 8:50 AM) General Not Present- Appetite Loss, Chills, Fatigue, Fever, Night Sweats, Weight Gain and Weight Loss. Skin Not Present- Change in Wart/Mole, Dryness, Hives, Jaundice, New Lesions, Non-Healing Wounds, Rash and Ulcer. HEENT Not Present- Earache, Hearing Loss, Hoarseness, Nose Bleed, Oral Ulcers, Ringing in the Ears, Seasonal Allergies, Sinus Pain, Sore Throat, Visual Disturbances, Wears glasses/contact lenses and Yellow Eyes. Respiratory Present- Snoring. Not Present- Bloody sputum, Chronic Cough, Difficulty Breathing and Wheezing. Breast Not Present- Breast Mass, Breast Pain, Nipple Discharge and Skin Changes. Cardiovascular Not Present- Chest Pain, Difficulty Breathing Lying Down, Leg Cramps, Palpitations, Rapid Heart Rate, Shortness of Breath and Swelling of Extremities. Gastrointestinal Not Present- Abdominal Pain, Bloating, Bloody Stool, Change in Bowel Habits, Chronic diarrhea, Constipation, Difficulty Swallowing, Excessive gas, Gets full quickly at meals, Hemorrhoids, Indigestion, Nausea, Rectal Pain and Vomiting. Male Genitourinary Not Present- Blood in Urine, Change in Urinary Stream, Frequency, Impotence, Nocturia, Painful Urination, Urgency and Urine Leakage. Musculoskeletal Not Present- Back Pain,  Joint Pain, Joint Stiffness,  Muscle Pain, Muscle Weakness and Swelling of Extremities. Neurological Not Present- Decreased Memory, Fainting, Headaches, Numbness, Seizures, Tingling, Tremor, Trouble walking and Weakness. Psychiatric Not Present- Anxiety, Bipolar, Change in Sleep Pattern, Depression, Fearful and Frequent crying. Endocrine Not Present- Cold Intolerance, Excessive Hunger, Hair Changes, Heat Intolerance, Hot flashes and New Diabetes. Hematology Not Present- Blood Thinners, Easy Bruising, Excessive bleeding, Gland problems, HIV and Persistent Infections.  Vitals ([name]; 10/17/2020 8:51 AM) 10/17/2020 8:51 AM Weight: 256.8 lb   Height: 780 in  Body Surface Area: 13.33 m   Body Mass Index: 0.3 kg/m   Pulse: 78 (Regular)    BP: 142/82(Sitting, Left Arm, Standard)        Physical Exam [name] MD; 10/17/2020 9:05 AM)  General Mental Status - Alert. General Appearance - Not in acute distress, Not Sickly. Orientation - Oriented X3. Hydration - Well hydrated. Voice - Normal.  Integumentary Global Assessment Upon inspection and palpation of skin surfaces of the - Axillae: non-tender, no inflammation or ulceration, no drainage. and Distribution of scalp and body hair is normal. General Characteristics Temperature - normal warmth is noted.  Head and Neck Head - normocephalic, atraumatic with no lesions or palpable masses. Face Global Assessment - atraumatic, no absence of expression. Neck Global Assessment - no abnormal movements, no bruit auscultated on the right, no bruit auscultated on the left, no decreased range of motion, non-tender. Trachea - midline. Thyroid Gland Characteristics - non-tender.  Eye Eyeball - Left - Extraocular movements intact, No Nystagmus - Left. Eyeball - Right - Extraocular movements intact, No Nystagmus - Right. Cornea - Left - No Hazy - Left. Cornea - Right - No Hazy - Right. Sclera/Conjunctiva - Left - No scleral icterus, No Discharge -  Left. Sclera/Conjunctiva - Right - No scleral icterus, No Discharge - Right. Pupil - Left - Direct reaction to light normal. Pupil - Right - Direct reaction to light normal.  ENMT Ears Pinna - Left - no drainage observed, no generalized tenderness observed. Pinna - Right - no drainage observed, no generalized tenderness observed. Nose and Sinuses External Inspection of the Nose - no destructive lesion observed. Inspection of the nares - Left - quiet respiration. Inspection of the nares - Right - quiet respiration. Mouth and Throat Lips - Upper Lip - no fissures observed, no pallor noted. Lower Lip - no fissures observed, no pallor noted. Nasopharynx - no discharge present. Oral Cavity/Oropharynx - Tongue - no dryness observed. Oral Mucosa - no cyanosis observed. Hypopharynx - no evidence of airway distress observed.  Chest and Lung Exam Inspection Movements - Normal and Symmetrical. Accessory muscles - No use of accessory muscles in breathing. Palpation Palpation of the chest reveals - Non-tender. Auscultation Breath sounds - Normal and Clear.  Cardiovascular Auscultation Rhythm - Regular. Murmurs & Other Heart Sounds - Auscultation of the heart reveals - No Murmurs and No Systolic Clicks.  Abdomen Inspection Inspection of the abdomen reveals - No Visible peristalsis and No Abnormal pulsations. Umbilicus - No Bleeding, No Urine drainage. Palpation/Percussion Palpation and Percussion of the abdomen reveal - Soft, Non Tender, No Rebound tenderness, No Rigidity (guarding) and No Cutaneous hyperesthesia. Note:  Abdomen soft.  Nontender.  Not distended.  No umbilical or incisional hernias.  No guarding.  Male Genitourinary Sexual Maturity Tanner 5 - Adult hair pattern and Adult penile size and shape. Note:  Left greater than right inguinal hernias. Left more sensitive. Going to grinds only. No testicular or  epididymal masses. Otherwise normal external genitalia. No enlarged lymph  nodes.  Peripheral Vascular Upper Extremity Inspection - Left - No Cyanotic nailbeds - Left, Not Ischemic. Inspection - Right - No Cyanotic nailbeds - Right, Not Ischemic.  Neurologic Neurologic evaluation reveals  - normal attention span and ability to concentrate, able to name objects and repeat phrases. Appropriate fund of knowledge , normal sensation and normal coordination. Mental Status Affect - not angry, not paranoid. Cranial Nerves - Normal Bilaterally. Gait - Normal.  Neuropsychiatric Mental status exam performed with findings of - able to articulate well with normal speech/language, rate, volume and coherence, thought content normal with ability to perform basic computations and apply abstract reasoning and no evidence of hallucinations, delusions, obsessions or homicidal/suicidal ideation.  Musculoskeletal Global Assessment Spine, Ribs and Pelvis - no instability, subluxation or laxity. Right Upper Extremity - no instability, subluxation or laxity.  Lymphatic Head & Neck  General Head & Neck Lymphatics: Bilateral - Description - No Localized lymphadenopathy. Axillary  General Axillary Region: Bilateral - Description - No Localized lymphadenopathy. Femoral & Inguinal  Generalized Femoral & Inguinal Lymphatics: Left - Description - No Localized lymphadenopathy. Right - Description - No Localized lymphadenopathy.    Assessment & Plan [name](Ambriella Kitt C. Jailani Hogans MD; 10/17/2020 9:04 AM)  BILATERAL INGUINAL HERNIA WITHOUT OBSTRUCTION OR GANGRENE, RECURRENCE NOT SPECIFIED (K40.20) Impression: Left greater than right inguinal hernias. Left side becoming larger and more symptomatic.  I think he would benefit from surgery to repair this. Good candidate for laparoscopic approach with outpatient surgery.  I did caution he will need to-3 weeks before returning to light duty in 6 weeks before he is unrestricted activity. 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 He may need a FMLA  paperwork. We will see.   PREOP - ING HERNIA - ENCOUNTER FOR PREOPERATIVE EXAMINATION FOR GENERAL SURGICAL PROCEDURE (Z01.818)  Current Plans You are being scheduled for surgery - Our schedulers will call you.   You should hear from our office's scheduling department within 5 working days about the location, date, and time of surgery.  We try to make accommodations for patient's preferences in scheduling surgery, but sometimes the OR schedule or the surgeon's schedule prevents us from making those accommodations.  If you have not heard from our office [phone]) in 5 working days, call the office and ask for your surgeon's nurse.  If you have other questions about your diagnosis, plan, or surgery, call the office and ask for your surgeon's nurse.  Written instructions provided The anatomy & physiology of the abdominal wall and pelvic floor was discussed.  The pathophysiology of hernias in the inguinal and pelvic region was discussed.  Natural history risks such as progressive enlargement, pain, incarceration, and strangulation was discussed.   Contributors to complications such as smoking, obesity, diabetes, prior surgery, etc were discussed.   I feel the risks of no intervention will lead to serious problems that outweigh the operative risks; therefore, I recommended surgery to reduce and repair the hernia.  I explained laparoscopic techniques with possible need for an open approach.  I noted usual use of mesh to patch and/or buttress hernia repair  Risks such as bleeding, infection, abscess, need for further treatment, heart attack, death, and other risks were discussed.  I noted a good likelihood this will help address the problem.   Goals of post-operative recovery were discussed as well.  Possibility that this will not correct all symptoms was explained.  I stressed the importance of low-impact  activity, aggressive pain control, avoiding constipation, & not pushing through pain to  minimize risk of post-operative chronic pain or injury. Possibility of reherniation was discussed.  We will work to minimize complications.    An educational handout further explaining the pathology & treatment options was given as well.  Questions were answered.  The patient expresses understanding & wishes to proceed with surgery.  Pt Education - Pamphlet Given - Laparoscopic Hernia Repair: discussed with patient and provided information. Pt Education - [facility] Pain Control (Kynleigh Artz) Pt Education - [facility] Hernia Post-Op HCI (Jannet Calip): discussed with patient and provided information. Pt Education - [facility] Mesh education: discussed with patient and provided information.  HISTORY OF SNORING (J47.829) Impression: He does have a history of snoring. He had a sleep study recently that showed very mild sleep apnea. Felt not needing any CPAP.  [name], MD, FACS, MASCRS Esophageal, Gastrointestinal & Colorectal Surgery Robotic and Minimally Invasive Surgery  [facility] [address] [place], [place] [address] [phone] Fax [phone] Main/Paging  CONTACT INFORMATION: Weekday (9AM-5PM) concerns: Call [facility] main office at [phone] Weeknight (5PM-9AM) or Weekend/Holiday concerns: Check [email or web address] for General Surgery [facility] coverage (Please, do not use SecureChat as it is not reliable communication to operating surgeons for immediate patient care)

## 2020-10-25 ENCOUNTER — Encounter: Payer: Self-pay | Admitting: Medical

## 2020-10-25 NOTE — Telephone Encounter (Signed)
Left message for pt to call back to discuss sleep study and fatigue

## 2020-11-07 ENCOUNTER — Encounter: Payer: Self-pay | Admitting: Medical

## 2020-11-07 ENCOUNTER — Ambulatory Visit (INDEPENDENT_AMBULATORY_CARE_PROVIDER_SITE_OTHER): Payer: BC Managed Care – PPO | Admitting: Medical

## 2020-11-07 VITALS — BP 136/84 | HR 76 | Temp 97.3°F | Ht 77.0 in | Wt 254.4 lb

## 2020-11-07 DIAGNOSIS — Z566 Other physical and mental strain related to work: Secondary | ICD-10-CM

## 2020-11-07 DIAGNOSIS — R5383 Other fatigue: Secondary | ICD-10-CM | POA: Diagnosis not present

## 2020-11-07 DIAGNOSIS — G4730 Sleep apnea, unspecified: Secondary | ICD-10-CM

## 2020-11-07 DIAGNOSIS — R030 Elevated blood-pressure reading, without diagnosis of hypertension: Secondary | ICD-10-CM
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
## 2020-11-08 ENCOUNTER — Other Ambulatory Visit: Payer: Self-pay | Admitting: Medical

## 2020-11-08 DIAGNOSIS — R7989 Other specified abnormal findings of blood chemistry: Secondary | ICD-10-CM

## 2020-11-08 DIAGNOSIS — R5383 Other fatigue: Secondary | ICD-10-CM

## 2020-11-08 LAB — TESTOSTERONE: Testosterone: 157 ng/dL — ABNORMAL LOW (ref 264–916)

## 2020-11-08 NOTE — Progress Notes (Signed)
Return for labs.  

## 2020-11-13 ENCOUNTER — Other Ambulatory Visit: Payer: BC Managed Care – PPO

## 2020-11-13 ENCOUNTER — Other Ambulatory Visit: Payer: Self-pay

## 2020-11-13 DIAGNOSIS — R7989 Other specified abnormal findings of blood chemistry: Secondary | ICD-10-CM | POA: Diagnosis not present

## 2020-11-13 DIAGNOSIS — R5383 Other fatigue: Secondary | ICD-10-CM

## 2020-11-14 LAB — TESTOSTERONE: Testosterone: 202 ng/dL — ABNORMAL LOW (ref 264–916)

## 2020-11-14 LAB — FSH/LH
FSH: 7 m[IU]/mL (ref 1.5–12.4)
LH: 3.9 m[IU]/mL (ref 1.7–8.6)

## 2020-11-14 LAB — PSA: Prostate Specific Ag, Serum: 2.1 ng/mL (ref 0.0–4.0)

## 2021-01-03 ENCOUNTER — Telehealth: Payer: BC Managed Care – PPO | Admitting: Physician Assistant

## 2021-01-03 DIAGNOSIS — U071 COVID-19: Secondary | ICD-10-CM | POA: Diagnosis not present

## 2021-01-03 DIAGNOSIS — Z20822 Contact with and (suspected) exposure to covid-19: Secondary | ICD-10-CM | POA: Diagnosis not present

## 2021-01-03 MED ORDER — BENZONATATE 100 MG PO CAPS: 100.0000 mg | ORAL_CAPSULE | Freq: Three times a day (TID) | ORAL | 0 refills | Status: DC | PRN

## 2021-01-03 MED ORDER — FLUTICASONE PROPIONATE 50 MCG/ACT NA SUSP: 2.0000 | Freq: Every day | NASAL | 0 refills | Status: DC

## 2021-01-03 MED ORDER — AEROCHAMBER PLUS FLO-VU MEDIUM MISC
1.0000 | Freq: Once | 0 refills | Status: AC
Start: 2021-01-03 — End: 2021-01-03

## 2021-01-03 MED ORDER — ALBUTEROL SULFATE HFA 108 (90 BASE) MCG/ACT IN AERS: 2.0000 | INHALATION_SPRAY | RESPIRATORY_TRACT | 0 refills | Status: DC | PRN

## 2021-01-03 NOTE — Progress Notes (Signed)
Mr. arvon, schreiner are scheduled for a virtual visit with your provider today.    Just as we do with appointments in the office, we must obtain your consent to participate.  Your consent will be active for this visit and any virtual visit you may have with one of our providers in the next 365 days.    If you have a MyChart account, I can also send a copy of this consent to you electronically.  All virtual visits are billed to your insurance company just like a traditional visit in the office.  As this is a virtual visit, video technology does not allow for your provider to perform a traditional examination.  This may limit your provider's ability to fully assess your condition.  If your provider identifies any concerns that need to be evaluated in person or the need to arrange testing such as labs, EKG, etc, we will make arrangements to do so.    Although advances in technology are sophisticated, we cannot ensure that it will always work on either your end or our end.  If the connection with a video visit is poor, we may have to switch to a telephone visit.  With either a video or telephone visit, we are not always able to ensure that we have a secure connection.   I need to obtain your verbal consent now.   Are you willing to proceed with your visit today?   [name] has provided verbal consent on 01/03/2021 for a virtual visit (video or telephone).   [name], PA-C 01/03/2021  6:47 PM   Date:  01/03/2021   ID:  Carl Wallace, DOB Jan 31, 1976, MRN [id number]  Patient Location: Home Provider Location: Home Office   Participants: Patient and Provider for Visit and Wrap up  Method of visit: Video  Location of Patient: Home Location of Provider: Home Office Consent was obtain for visit over the video. Services rendered by provider: Visit was performed via video  A video enabled telemedicine application was used and I verified that I am speaking with the correct person using two  identifiers.  PCP:  Jac Canavan, PA-C   Chief Complaint:  COVID-19  History of Present Illness:    Carl Wallace is a 45 y.o. male with history as stated below. Presents video telehealth for an acute care visit.  Awoke with sore throat on Monday morning - 3 days.  Gradual onset, progressive and persistent.   Pt reports a hx of seasonal allergies and thought that was the issue.  Felt worse on Tuesday and had a positive COVID test that night.  Pt reports using mucinex and drinking hot tea.  Denies fever, shortness of breath.  Pt is vaccinated against COVID.   This morning he went to [facility] and got a PCR test which has not resulted.   PMHX: HTN  Creat in May 2022 was 1.14.   Past Medical, Surgical, Social History, Allergies, and Medications have been Reviewed.  Patient Active Problem List   Diagnosis Date Noted   Mild sleep apnea 11/07/2020   Snoring 09/11/2020   Mood change 09/11/2020   Fatigue 09/11/2020   Work stress 09/11/2020   Screening for thyroid disorder 09/11/2020   Screening for deficiency anemia 09/11/2020   Elevated blood-pressure reading without diagnosis of hypertension 09/11/2020   Bilateral inguinal hernia without obstruction or gangrene 10/09/2016   Screening for prostate cancer 10/09/2016   Family history of prostate cancer 10/09/2016   Encounter for health maintenance examination in adult  05/18/2015   Need for prophylactic vaccination and inoculation against influenza 05/18/2015    Social History   Tobacco Use   Smoking status: Never   Smokeless tobacco: Never  Substance Use Topics   Alcohol use: Yes    Alcohol/week: 3.0 standard drinks    Types: 1 Cans of beer, 1 Glasses of wine, 1 Shots of liquor per week    Comment: occ     Current Outpatient Medications:    albuterol (VENTOLIN HFA) 108 (90 Base) MCG/ACT inhaler, Inhale 2 puffs into the lungs every 2 (two) hours as needed for wheezing or shortness of breath (cough)., Disp: 8 g, Rfl:  0   benzonatate (TESSALON PERLES) 100 MG capsule, Take 1 capsule (100 mg total) by mouth 3 (three) times daily as needed for cough (cough)., Disp: 20 capsule, Rfl: 0   fluticasone (FLONASE) 50 MCG/ACT nasal spray, Place 2 sprays into both nostrils daily., Disp: 9.9 g, Rfl: 0   Spacer/Aero-Holding Chambers (AEROCHAMBER PLUS FLO-VU MEDIUM) MISC, 1 each by Other route once for 1 dose., Disp: 1 each, Rfl: 0   valACYclovir (VALTREX) 500 MG tablet, 1 tablet po BID x 3 days for flare up (Patient not taking: No sig reported), Disp: 30 tablet, Rfl: 1   No Known Allergies   Review of Systems  Constitutional:  Positive for chills and malaise/fatigue. Negative for fever.  HENT:  Positive for congestion and sore throat. Negative for ear pain.   Eyes:  Negative for blurred vision and double vision.  Respiratory:  Positive for cough. Negative for shortness of breath and wheezing.   Cardiovascular:  Negative for chest pain, palpitations and leg swelling.  Gastrointestinal:  Negative for abdominal pain, diarrhea, nausea and vomiting.  Genitourinary:  Negative for dysuria.  Musculoskeletal:  Positive for myalgias.  Skin:  Negative for rash.  Neurological:  Positive for headaches. Negative for loss of consciousness and weakness.  Psychiatric/Behavioral:  The patient is not nervous/anxious.   See HPI for history of present illness.  Physical Exam Constitutional:      General: He is not in acute distress.    Appearance: Normal appearance. He is well-developed. He is not ill-appearing.  HENT:     Head: Normocephalic and atraumatic.     Nose: Nose normal.  Eyes:     General: No scleral icterus.    Conjunctiva/sclera: Conjunctivae normal.  Pulmonary:     Effort: Pulmonary effort is normal.     Comments: Speaks in full sentences Musculoskeletal:        General: Normal range of motion.     Cervical back: Normal range of motion.  Skin:    Coloration: Skin is not pale.  Neurological:     General: No  focal deficit present.     Mental Status: He is alert.  Psychiatric:        Mood and Affect: Mood normal.              A&P  1. COVID-19  - albuterol, tessalon (cough medication) and flonase (nasal spray) for symptom control  - OTC mucinex, advil and tylenol for additional symptom control  - got to urgent care or ER for shortness of breath or other symptoms  - discussed risk/benefit of Paxlovid.  Pt wishes to proceed with only symptom control at this time.  I would agree given his mild symptoms and low risk profile.    Patient voiced understanding and agreement to plan.   Time:   Today, I have spent 15 minutes  with the patient with telehealth technology discussing the above problems, reviewing the chart, previous notes, medications and orders.    Tests Ordered: No orders of the defined types were placed in this encounter.   Medication Changes: Meds ordered this encounter  Medications   albuterol (VENTOLIN HFA) 108 (90 Base) MCG/ACT inhaler    Sig: Inhale 2 puffs into the lungs every 2 (two) hours as needed for wheezing or shortness of breath (cough).    Dispense:  8 g    Refill:  0   Spacer/Aero-Holding Chambers (AEROCHAMBER PLUS FLO-VU MEDIUM) MISC    Sig: 1 each by Other route once for 1 dose.    Dispense:  1 each    Refill:  0   benzonatate (TESSALON PERLES) 100 MG capsule    Sig: Take 1 capsule (100 mg total) by mouth 3 (three) times daily as needed for cough (cough).    Dispense:  20 capsule    Refill:  0   fluticasone (FLONASE) 50 MCG/ACT nasal spray    Sig: Place 2 sprays into both nostrils daily.    Dispense:  9.9 g    Refill:  0     Disposition:  Follow up UC or ED as needed  SignedDierdre Forth, PA-C  01/03/2021 6:47 PM

## 2021-01-03 NOTE — Patient Instructions (Signed)
1. COVID-19  - albuterol, tessalon (cough medication) and flonase (nasal spray) for symptom control  - OTC mucinex, advil and tylenol for additional symptom control  - got to urgent care or ER for shortness of breath or other symptoms

## 2021-10-08 ENCOUNTER — Encounter: Payer: Self-pay | Admitting: Medical

## 2021-10-08 ENCOUNTER — Ambulatory Visit (INDEPENDENT_AMBULATORY_CARE_PROVIDER_SITE_OTHER): Payer: BC Managed Care – PPO | Admitting: Medical

## 2021-10-08 VITALS — BP 130/88 | HR 78 | Ht 77.0 in | Wt 266.6 lb

## 2021-10-08 DIAGNOSIS — Z1159 Encounter for screening for other viral diseases: Secondary | ICD-10-CM | POA: Diagnosis not present

## 2021-10-08 DIAGNOSIS — Z125 Encounter for screening for malignant neoplasm of prostate: Secondary | ICD-10-CM | POA: Diagnosis not present

## 2021-10-08 DIAGNOSIS — R030 Elevated blood-pressure reading, without diagnosis of hypertension: Secondary | ICD-10-CM

## 2021-10-08 DIAGNOSIS — Z0000 Encounter for general adult medical examination without abnormal findings: Secondary | ICD-10-CM | POA: Diagnosis not present

## 2021-10-08 DIAGNOSIS — Z23 Encounter for immunization: Secondary | ICD-10-CM | POA: Diagnosis not present

## 2021-10-08 DIAGNOSIS — Z13220 Encounter for screening for lipoid disorders: Secondary | ICD-10-CM

## 2021-10-08 DIAGNOSIS — K4020 Bilateral inguinal hernia, without obstruction or gangrene, not specified as recurrent: Secondary | ICD-10-CM

## 2021-10-08 DIAGNOSIS — Z8042 Family history of malignant neoplasm of prostate: Secondary | ICD-10-CM

## 2021-10-08 DIAGNOSIS — Z1211 Encounter for screening for malignant neoplasm of colon: Secondary | ICD-10-CM

## 2021-10-08 LAB — POCT URINALYSIS DIP (PROADVANTAGE DEVICE)
Bilirubin, UA: NEGATIVE
Blood, UA: NEGATIVE
Glucose, UA: NEGATIVE mg/dL
Ketones, POC UA: NEGATIVE mg/dL
Leukocytes, UA: NEGATIVE
Nitrite, UA: NEGATIVE
Protein Ur, POC: NEGATIVE mg/dL
Specific Gravity, Urine: 1.03
Urobilinogen, Ur: NEGATIVE
pH, UA: 6 (ref 5.0–8.0)

## 2021-10-08 NOTE — Progress Notes (Signed)
Subjective:   HPI  Carl Wallace is a 46 y.o. male who presents for Chief Complaint  Patient presents with   fasting cpe    Fasting cpe, would like prostate checked      Patient Care Team: Siara Gorder, Camelia Eng, PA-C as PCP - General (Family Medicine) Michael Boston, MD as Consulting Physician (General Surgery) Sees dentist Sees eye doctor  Concerns: None in particular.   Doing well.  Reviewed their medical, surgical, family, social, medication, and allergy history and updated chart as appropriate.  Past Medical History:  Diagnosis Date   Allergic rhinitis    Childhood asthma    Genital herpes    H/O testicular mass    evaluatd by Urology 2006   Hypertension 04/2015   Knee injury    left knee remote past   Knee pain, bilateral    Left knee injury    remote past with muscle tear   Low back pain    Wears glasses    alternates between glasses and contacts    Past Surgical History:  Procedure Laterality Date   NO PAST SURGERIES      Family History  Problem Relation Age of Onset   Hypertension Mother    Hypertension Brother    Asthma Daughter    Diabetes Maternal Grandmother    Hypertension Maternal Grandmother    Cancer Maternal Uncle        prostate     Current Outpatient Medications:    albuterol (VENTOLIN HFA) 108 (90 Base) MCG/ACT inhaler, Inhale 2 puffs into the lungs every 2 (two) hours as needed for wheezing or shortness of breath (cough)., Disp: 8 g, Rfl: 0   fluticasone (FLONASE) 50 MCG/ACT nasal spray, Place 2 sprays into both nostrils daily. (Patient not taking: Reported on 10/08/2021), Disp: 9.9 g, Rfl: 0   valACYclovir (VALTREX) 500 MG tablet, 1 tablet po BID x 3 days for flare up (Patient not taking: Reported on 10/08/2021), Disp: 30 tablet, Rfl: 1  No Known Allergies   Review of Systems Constitutional: -fever, -chills, -sweats, -unexpected weight change, -decreased appetite, -fatigue Allergy: -sneezing, -itching, -congestion Dermatology:  -changing moles, --rash, -lumps ENT: -runny nose, -ear pain, -sore throat, -hoarseness, -sinus pain, -teeth pain, - ringing in ears, -hearing loss, -nosebleeds Cardiology: -chest pain, -palpitations, -swelling, -difficulty breathing when lying flat, -waking up short of breath Respiratory: -cough, -shortness of breath, -difficulty breathing with exercise or exertion, -wheezing, -coughing up blood Gastroenterology: -abdominal pain, -nausea, -vomiting, -diarrhea, -constipation, -blood in stool, -changes in bowel movement, -difficulty swallowing or eating Hematology: -bleeding, -bruising  Musculoskeletal: -joint aches, -muscle aches, -joint swelling, -back pain, -neck pain, -cramping, -changes in gait Ophthalmology: denies vision changes, eye redness, itching, discharge Urology: -burning with urination, -difficulty urinating, -blood in urine, -urinary frequency, -urgency, -incontinence Neurology: -headache, -weakness, -tingling, -numbness, -memory loss, -falls, -dizziness Psychology: -depressed mood, -agitation, -sleep problems Male GU: no testicular mass, pain, no lymph nodes swollen, no swelling, no rash.     10/08/2021    8:38 AM 09/11/2020   12:08 PM 10/09/2016    1:52 PM 05/18/2015   11:00 AM  Depression screen PHQ 2/9  Decreased Interest 0 0 0 0  Down, Depressed, Hopeless 0 0 0 0  PHQ - 2 Score 0 0 0 0        Objective:  BP 130/88   Pulse 78   Ht 6\' 5"  (1.956 m)   Wt 266 lb 9.6 oz (120.9 kg)   BMI 31.61 kg/m   General appearance:  alert, no distress, WD/WN, African American male Skin: unremarkable HEENT: normocephalic, conjunctiva/corneas normal, sclerae anicteric, PERRLA, EOMi, nares patent, no discharge or erythema, pharynx normal Oral cavity: MMM, tongue normal, teeth in good repair Neck: supple, no lymphadenopathy, no thyromegaly, no masses, normal ROM, no bruits Chest: non tender, normal shape and expansion Heart: RRR, normal S1, S2, no murmurs Lungs: CTA bilaterally, no  wheezes, rhonchi, or rales Abdomen: +bs, soft, non tender, non distended, no masses, no hepatomegaly, no splenomegaly, no bruits Back: non tender, normal ROM, no scoliosis Musculoskeletal: upper extremities non tender, no obvious deformity, normal ROM throughout, lower extremities non tender, no obvious deformity, normal ROM throughout Extremities: no edema, no cyanosis, no clubbing Pulses: 2+ symmetric, upper and lower extremities, normal cap refill Neurological: alert, oriented x 3, CN2-12 intact, strength normal upper extremities and lower extremities, sensation normal throughout, DTRs 2+ throughout, no cerebellar signs, gait normal Psychiatric: normal affect, behavior normal, pleasant  GU: normal male external genitalia,circumcised, nontender, no masses, no hernia, no lymphadenopathy Rectal: anus normal tone, prostate WNL   Assessment and Plan :   Encounter Diagnoses  Name Primary?   Encounter for health maintenance examination in adult Yes   Family history of prostate cancer    Screening for prostate cancer    Screening for lipid disorders    Screen for colon cancer    Encounter for hepatitis C screening test for low risk patient    Need for pneumococcal vaccination    Elevated blood-pressure reading without diagnosis of hypertension    Bilateral inguinal hernia without obstruction or gangrene, recurrence not specified     This visit was a preventative care visit, also known as wellness visit or routine physical.   Topics typically include healthy lifestyle, diet, exercise, preventative care, vaccinations, sick and well care, proper use of emergency dept and after hours care, as well as other concerns.     Recommendations: Continue to return yearly for your annual wellness and preventative care visits.  This gives [place] a chance to discuss healthy lifestyle, exercise, vaccinations, review your chart record, and perform screenings where appropriate.  I recommend you see your eye  doctor yearly for routine vision care.  I recommend you see your dentist yearly for routine dental care including hygiene visits twice yearly.   Vaccination recommendations were reviewed Immunization History  Administered Date(s) Administered   Influenza,inj,Quad PF,6+ Mos 07/20/2014, 05/18/2015   Influenza-Unspecified 01/19/2016, 01/07/2019   PFIZER(Purple Top)SARS-COV-2 Vaccination 07/10/2019, 08/03/2019, 02/28/2020   Td 07/20/2014    Counseled on the pneumococcal vaccine.  Vaccine information sheet given.  Pneumococcal vaccine PPSV23 given after consent obtained.   Screening for cancer: Colon cancer screening: We will refer you for screening colonoscopy  Testicular cancer screening You should do a monthly self testicular exam if you are between 18-20 years old  We discussed PSA, prostate exam, and prostate cancer screening risks/benefits.     Skin cancer screening: Check your skin regularly for new changes, growing lesions, or other lesions of concern Come in for evaluation if you have skin lesions of concern.  Lung cancer screening: If you have a greater than 20 pack year history of tobacco use, then you may qualify for lung cancer screening with a chest CT scan.   Please call your insurance company to inquire about coverage for this test.  We currently don't have screenings for other cancers besides breast, cervical, colon, and lung cancers.  If you have a strong family history of cancer or have other cancer screening  concerns, please let me know.    Bone health: Get at least 150 minutes of aerobic exercise weekly Get weight bearing exercise at least once weekly Bone density test:  A bone density test is an imaging test that uses a type of X-ray to measure the amount of calcium and other minerals in your bones. The test may be used to diagnose or screen you for a condition that causes weak or thin bones (osteoporosis), predict your risk for a broken bone (fracture), or  determine how well your osteoporosis treatment is working. The bone density test is recommended for females 81 and older, or females or males [id number] if certain risk factors such as thyroid disease, long term use of steroids such as for asthma or rheumatological issues, vitamin D deficiency, estrogen deficiency, family history of osteoporosis, self or family history of fragility fracture in first degree relative.    Heart health: Get at least 150 minutes of aerobic exercise weekly Limit alcohol It is important to maintain a healthy blood pressure and healthy cholesterol numbers  Heart disease screening: Screening for heart disease includes screening for blood pressure, fasting lipids, glucose/diabetes screening, BMI height to weight ratio, reviewed of smoking status, physical activity, and diet.    Goals include blood pressure 120/80 or less, maintaining a healthy lipid/cholesterol profile, preventing diabetes or keeping diabetes numbers under good control, not smoking or using tobacco products, exercising most days per week or at least 150 minutes per week of exercise, and eating healthy variety of fruits and vegetables, healthy oils, and avoiding unhealthy food choices like fried food, fast food, high sugar and high cholesterol foods.    Other tests may possibly include EKG test, CT coronary calcium score, echocardiogram, exercise treadmill stress test.     Medical care options: I recommend you continue to seek care here first for routine care.  We try really hard to have available appointments Monday through Friday daytime hours for sick visits, acute visits, and physicals.  Urgent care should be used for after hours and weekends for significant issues that cannot wait till the next day.  The emergency department should be used for significant potentially life-threatening emergencies.  The emergency department is expensive, can often have long wait times for less significant concerns, so try to  utilize primary care, urgent care, or telemedicine when possible to avoid unnecessary trips to the emergency department.  Virtual visits and telemedicine have been introduced since the pandemic started in 2020, and can be convenient ways to receive medical care.  We offer virtual appointments as well to assist you in a variety of options to seek medical care.    Separate significant issues discussed: Asthma - no recent concerns, continue albuterol prn  Elevated BP - discussed possible complications of uncontrolled HTN.  He has hx/o mild sleep apnea as well.  Discussed weight loss, salt restriction, possible trial of CPAP, possibly considering medication.   He will think about his options.     Aubry was seen today for fasting cpe.  Diagnoses and all orders for this visit:  Encounter for health maintenance examination in adult -     Comprehensive metabolic panel -     CBC -     PSA -     Lipid panel -     POCT Urinalysis DIP (Proadvantage Device) -     Hepatitis C antibody  Family history of prostate cancer -     PSA  Screening for prostate cancer -  PSA  Screening for lipid disorders -     Lipid panel  Screen for colon cancer -     Ambulatory referral to Gastroenterology  Encounter for hepatitis C screening test for low risk patient -     Hepatitis C antibody  Need for pneumococcal vaccination  Elevated blood-pressure reading without diagnosis of hypertension  Bilateral inguinal hernia without obstruction or gangrene, recurrence not specified    Follow-up pending labs, yearly for physical

## 2021-10-09 ENCOUNTER — Other Ambulatory Visit: Payer: Self-pay | Admitting: Medical

## 2021-10-09 LAB — LIPID PANEL
Chol/HDL Ratio: 3.7 ratio (ref 0.0–5.0)
Cholesterol, Total: 168 mg/dL (ref 100–199)
HDL: 46 mg/dL (ref >39)
LDL Chol Calc (NIH): 105 mg/dL — ABNORMAL HIGH (ref 0–99)
Triglycerides: 89 mg/dL (ref 0–149)
VLDL Cholesterol Cal: 17 mg/dL (ref 5–40)

## 2021-10-09 LAB — CBC
Hematocrit: 43.5 % (ref 37.5–51.0)
Hemoglobin: 14.6 g/dL (ref 13.0–17.7)
MCH: 28.1 pg (ref 26.6–33.0)
MCHC: 33.6 g/dL (ref 31.5–35.7)
MCV: 84 fL (ref 79–97)
Platelets: 270 10*3/uL (ref 150–450)
RBC: 5.2 x10E6/uL (ref 4.14–5.80)
RDW: 12.3 % (ref 11.6–15.4)
WBC: 6.9 10*3/uL (ref 3.4–10.8)

## 2021-10-09 LAB — COMPREHENSIVE METABOLIC PANEL
ALT: 18 IU/L (ref 0–44)
AST: 22 IU/L (ref 0–40)
Albumin/Globulin Ratio: 1.7 (ref 1.2–2.2)
Albumin: 4.6 g/dL (ref 4.0–5.0)
Alkaline Phosphatase: 52 IU/L (ref 44–121)
BUN/Creatinine Ratio: 12 (ref 9–20)
BUN: 15 mg/dL (ref 6–24)
Bilirubin Total: 0.7 mg/dL (ref 0.0–1.2)
CO2: 27 mmol/L (ref 20–29)
Calcium: 9.7 mg/dL (ref 8.7–10.2)
Chloride: 103 mmol/L (ref 96–106)
Creatinine, Ser: 1.28 mg/dL — ABNORMAL HIGH (ref 0.76–1.27)
Globulin, Total: 2.7 g/dL (ref 1.5–4.5)
Glucose: 99 mg/dL (ref 70–99)
Potassium: 4.3 mmol/L (ref 3.5–5.2)
Sodium: 144 mmol/L (ref 134–144)
Total Protein: 7.3 g/dL (ref 6.0–8.5)
eGFR: 69 mL/min/{1.73_m2} (ref >59)

## 2021-10-09 LAB — PSA: Prostate Specific Ag, Serum: 1.9 ng/mL (ref 0.0–4.0)

## 2021-10-09 LAB — HEPATITIS C ANTIBODY: Hep C Virus Ab: NONREACTIVE

## 2021-10-09 MED ORDER — ALBUTEROL SULFATE HFA 108 (90 BASE) MCG/ACT IN AERS: 2.0000 | INHALATION_SPRAY | RESPIRATORY_TRACT | 1 refills | Status: DC | PRN

## 2022-01-02 ENCOUNTER — Encounter: Payer: Self-pay | Admitting: Internal Medicine

## 2022-02-13 ENCOUNTER — Encounter: Payer: Self-pay | Admitting: Medical

## 2022-10-10 ENCOUNTER — Encounter: Payer: BC Managed Care – PPO | Admitting: Medical

## 2022-10-22 ENCOUNTER — Ambulatory Visit (INDEPENDENT_AMBULATORY_CARE_PROVIDER_SITE_OTHER): Payer: BC Managed Care – PPO | Admitting: Medical

## 2022-10-22 ENCOUNTER — Encounter: Payer: Self-pay | Admitting: Medical

## 2022-10-22 VITALS — BP 136/86 | HR 69 | Ht 77.0 in | Wt 253.6 lb

## 2022-10-22 DIAGNOSIS — R030 Elevated blood-pressure reading, without diagnosis of hypertension: Secondary | ICD-10-CM | POA: Diagnosis not present

## 2022-10-22 DIAGNOSIS — Z0000 Encounter for general adult medical examination without abnormal findings: Secondary | ICD-10-CM

## 2022-10-22 DIAGNOSIS — Z125 Encounter for screening for malignant neoplasm of prostate: Secondary | ICD-10-CM

## 2022-10-22 DIAGNOSIS — R350 Frequency of micturition: Secondary | ICD-10-CM

## 2022-10-22 DIAGNOSIS — Z1211 Encounter for screening for malignant neoplasm of colon: Secondary | ICD-10-CM

## 2022-10-22 DIAGNOSIS — Z13220 Encounter for screening for lipoid disorders: Secondary | ICD-10-CM

## 2022-10-22 DIAGNOSIS — K4090 Unilateral inguinal hernia, without obstruction or gangrene, not specified as recurrent: Secondary | ICD-10-CM

## 2022-10-22 DIAGNOSIS — Z8042 Family history of malignant neoplasm of prostate: Secondary | ICD-10-CM

## 2022-10-22 LAB — POCT URINALYSIS DIP (PROADVANTAGE DEVICE)
Bilirubin, UA: NEGATIVE
Blood, UA: NEGATIVE
Glucose, UA: NEGATIVE mg/dL
Ketones, POC UA: NEGATIVE mg/dL
Leukocytes, UA: NEGATIVE
Nitrite, UA: NEGATIVE
Protein Ur, POC: NEGATIVE mg/dL
Specific Gravity, Urine: 1.01
Urobilinogen, Ur: 0.2
pH, UA: 7.5 (ref 5.0–8.0)

## 2022-10-22 MED ORDER — VALACYCLOVIR HCL 500 MG PO TABS: ORAL_TABLET | ORAL | 3 refills | Status: DC

## 2022-10-22 NOTE — Progress Notes (Signed)
Subjective:   HPI  Carl Wallace is a 47 y.o. male who presents for Chief Complaint  Patient presents with   Annual Exam    Fasting annual exam. Would like to have colonoscopy ordered today as he has not one. Also saw went to [facility] for initial consult for hernia but never followed up. Does he need another referral vs can he call them to reschedule?    Patient Care Team: Milind Raether, Cleda Mccreedy as PCP - General (Family Medicine) Karie Soda, MD as Consulting Physician (General Surgery) Sees dentist Sees eye doctor  Concerns: He notes some concerns about urination.  In recent months has a little bit more urine frequency than in the past.  Sometimes he gets up to urinate once at night sometimes not.  He wants to recheck on his left inguinal hernia.  He had a consult with surgery a while back but never got around to doing surgery.  He would like a screening colonoscopy  Reviewed their medical, surgical, family, social, medication, and allergy history and updated chart as appropriate.  Past Medical History:  Diagnosis Date   Allergic rhinitis    Childhood asthma    Genital herpes    H/O testicular mass    evaluatd by Urology 2006   Hypertension 04/2015   Knee injury    left knee remote past   Knee pain, bilateral    Left knee injury    remote past with muscle tear   Low back pain    Wears glasses    alternates between glasses and contacts    Past Surgical History:  Procedure Laterality Date   NO PAST SURGERIES      Family History  Problem Relation Age of Onset   Hypertension Mother    Hypertension Brother    Asthma Daughter    Diabetes Maternal Grandmother    Hypertension Maternal Grandmother    Cancer Maternal Uncle        prostate     Current Outpatient Medications:    albuterol (VENTOLIN HFA) 108 (90 Base) MCG/ACT inhaler, Inhale 2 puffs into the lungs every 2 (two) hours as needed for wheezing or shortness of breath (cough). (Patient not taking: Reported  on 10/22/2022), Disp: 8 g, Rfl: 1   fluticasone (FLONASE) 50 MCG/ACT nasal spray, Place 2 sprays into both nostrils daily. (Patient not taking: Reported on 10/08/2021), Disp: 9.9 g, Rfl: 0   valACYclovir (VALTREX) 500 MG tablet, 1 tablet po BID x 3 days for flare up, Disp: 30 tablet, Rfl: 3  No Known Allergies   Review of Systems  Constitutional:  Negative for chills, fever, malaise/fatigue and weight loss.  HENT:  Negative for congestion, ear pain, hearing loss, sore throat and tinnitus.   Eyes:  Negative for blurred vision, pain and redness.  Respiratory:  Negative for cough, hemoptysis and shortness of breath.   Cardiovascular:  Negative for chest pain, palpitations, orthopnea, claudication and leg swelling.  Gastrointestinal:  Negative for abdominal pain, blood in stool, constipation, diarrhea, nausea and vomiting.  Genitourinary:  Positive for frequency. Negative for dysuria, flank pain, hematuria and urgency.  Musculoskeletal:  Negative for falls, joint pain and myalgias.  Skin:  Negative for itching and rash.  Neurological:  Negative for dizziness, tingling, speech change, weakness and headaches.  Endo/Heme/Allergies:  Negative for polydipsia. Does not bruise/bleed easily.  Psychiatric/Behavioral:  Negative for depression and memory loss. The patient is not nervous/anxious and does not have insomnia.  10/22/2022   10:23 AM 10/08/2021    8:38 AM 09/11/2020   12:08 PM 10/09/2016    1:52 PM 05/18/2015   11:00 AM  Depression screen PHQ 2/9  Decreased Interest 0 0 0 0 0  Down, Depressed, Hopeless 0 0 0 0 0  PHQ - 2 Score 0 0 0 0 0        Objective:  BP 136/86   Pulse 69   Ht 6\' 5"  (1.956 m)   Wt 253 lb 9.6 oz (115 kg)   SpO2 98%   BMI 30.07 kg/m   General appearance: alert, no distress, WD/WN, African American male Skin: unremarkable HEENT: normocephalic, conjunctiva/corneas normal, sclerae anicteric, PERRLA, EOMi, nares patent, no discharge or erythema, pharynx  normal Oral cavity: MMM, tongue normal, teeth in good repair Neck: supple, no lymphadenopathy, no thyromegaly, no masses, normal ROM, no bruits Chest: non tender, normal shape and expansion Heart: RRR, normal S1, S2, no murmurs Lungs: CTA bilaterally, no wheezes, rhonchi, or rales Abdomen: +bs, soft, non tender, non distended, no masses, no hepatomegaly, no splenomegaly, no bruits Back: non tender, normal ROM, no scoliosis Musculoskeletal: upper extremities non tender, no obvious deformity, normal ROM throughout, lower extremities non tender, no obvious deformity, normal ROM throughout Extremities: no edema, no cyanosis, no clubbing Pulses: 2+ symmetric, upper and lower extremities, normal cap refill Neurological: alert, oriented x 3, CN2-12 intact, strength normal upper extremities and lower extremities, sensation normal throughout, DTRs 2+ throughout, no cerebellar signs, gait normal Psychiatric: normal affect, behavior normal, pleasant  GU: normal male external genitalia,circumcised, nontender, no masses, left bulging inguinal hernia reducible,, no lymphadenopathy Rectal: anus normal tone, prostate WNL   Assessment and Plan :   Encounter Diagnoses  Name Primary?   Encounter for health maintenance examination in adult Yes   Screening for malignant neoplasm of colon    Elevated blood-pressure reading without diagnosis of hypertension    Screening for prostate cancer    Family history of prostate cancer    Screen for colon cancer    Urinary frequency    Screening for lipid disorders    Left inguinal hernia     This visit was a preventative care visit, also known as wellness visit or routine physical.   Topics typically include healthy lifestyle, diet, exercise, preventative care, vaccinations, sick and well care, proper use of emergency dept and after hours care, as well as other concerns.     Recommendations: Continue to return yearly for your annual wellness and preventative  care visits.  This gives [place] a chance to discuss healthy lifestyle, exercise, vaccinations, review your chart record, and perform screenings where appropriate.  I recommend you see your eye doctor yearly for routine vision care.  I recommend you see your dentist yearly for routine dental care including hygiene visits twice yearly.   Vaccination recommendations were reviewed Immunization History  Administered Date(s) Administered   Influenza,inj,Quad PF,6+ Mos 07/20/2014, 05/18/2015   Influenza-Unspecified 01/19/2016, 01/07/2019   PFIZER(Purple Top)SARS-COV-2 Vaccination 07/10/2019, 08/03/2019, 02/28/2020   Pneumococcal Polysaccharide-23 10/08/2021   Td 07/20/2014     Screening for cancer: Colon cancer screening: We will refer you for screening colonoscopy  Testicular cancer screening You should do a monthly self testicular exam if you are between 54-3 years old  We discussed PSA, prostate exam, and prostate cancer screening risks/benefits.     Skin cancer screening: Check your skin regularly for new changes, growing lesions, or other lesions of concern Come in for evaluation if you have  skin lesions of concern.  Lung cancer screening: If you have a greater than 20 pack year history of tobacco use, then you may qualify for lung cancer screening with a chest CT scan.   Please call your insurance company to inquire about coverage for this test.  We currently don't have screenings for other cancers besides breast, cervical, colon, and lung cancers.  If you have a strong family history of cancer or have other cancer screening concerns, please let me know.    Bone health: Get at least 150 minutes of aerobic exercise weekly Get weight bearing exercise at least once weekly Bone density test:  A bone density test is an imaging test that uses a type of X-ray to measure the amount of calcium and other minerals in your bones. The test may be used to diagnose or screen you for a  condition that causes weak or thin bones (osteoporosis), predict your risk for a broken bone (fracture), or determine how well your osteoporosis treatment is working. The bone density test is recommended for females 65 and older, or females or males <65 if certain risk factors such as thyroid disease, long term use of steroids such as for asthma or rheumatological issues, vitamin D deficiency, estrogen deficiency, family history of osteoporosis, self or family history of fragility fracture in first degree relative.    Heart health: Get at least 150 minutes of aerobic exercise weekly Limit alcohol It is important to maintain a healthy blood pressure and healthy cholesterol numbers  Heart disease screening: Screening for heart disease includes screening for blood pressure, fasting lipids, glucose/diabetes screening, BMI height to weight ratio, reviewed of smoking status, physical activity, and diet.    Goals include blood pressure 120/80 or less, maintaining a healthy lipid/cholesterol profile, preventing diabetes or keeping diabetes numbers under good control, not smoking or using tobacco products, exercising most days per week or at least 150 minutes per week of exercise, and eating healthy variety of fruits and vegetables, healthy oils, and avoiding unhealthy food choices like fried food, fast food, high sugar and high cholesterol foods.    Other tests may possibly include EKG test, CT coronary calcium score, echocardiogram, exercise treadmill stress test.     Medical care options: I recommend you continue to seek care here first for routine care.  We try really hard to have available appointments Monday through Friday daytime hours for sick visits, acute visits, and physicals.  Urgent care should be used for after hours and weekends for significant issues that cannot wait till the next day.  The emergency department should be used for significant potentially life-threatening emergencies.  The  emergency department is expensive, can often have long wait times for less significant concerns, so try to utilize primary care, urgent care, or telemedicine when possible to avoid unnecessary trips to the emergency department.  Virtual visits and telemedicine have been introduced since the pandemic started in 2020, and can be convenient ways to receive medical care.  We offer virtual appointments as well to assist you in a variety of options to seek medical care.    Separate significant issues discussed: Asthma - no recent concerns, continue albuterol prn  Elevated BP - discussed possible complications of uncontrolled HTN.  He has hx/o mild sleep apnea as well.  Discussed weight loss, salt restriction, possible trial of CPAP, possibly considering medication.   He will think about his options.   Urinary frequency-discussed possible causes.  Updated labs today.  Likely some mild BPH  symptoms.  Left inguinal hernia-referral back to general surgery for surgical repair   Toby was seen today for annual exam.  Diagnoses and all orders for this visit:  Encounter for health maintenance examination in adult -     CBC with Differential/Platelet -     CMP14+EGFR -     Lipid panel -     POCT Urinalysis DIP (Proadvantage Device) -     Ambulatory referral to Gastroenterology  Screening for malignant neoplasm of colon  Elevated blood-pressure reading without diagnosis of hypertension  Screening for prostate cancer -     PSA  Family history of prostate cancer  Screen for colon cancer -     Ambulatory referral to Gastroenterology  Urinary frequency  Screening for lipid disorders  Left inguinal hernia -     Ambulatory referral to General Surgery  Other orders -     valACYclovir (VALTREX) 500 MG tablet; 1 tablet po BID x 3 days for flare up    Follow-up pending labs, yearly for physical

## 2022-10-23 LAB — CBC WITH DIFFERENTIAL/PLATELET
Basophils Absolute: 0.1 10*3/uL (ref 0.0–0.2)
Basos: 1 %
EOS (ABSOLUTE): 0.1 10*3/uL (ref 0.0–0.4)
Eos: 2 %
Hematocrit: 45.7 % (ref 37.5–51.0)
Hemoglobin: 14.8 g/dL (ref 13.0–17.7)
Immature Grans (Abs): 0 10*3/uL (ref 0.0–0.1)
Immature Granulocytes: 0 %
Lymphocytes Absolute: 2.3 10*3/uL (ref 0.7–3.1)
Lymphs: 31 %
MCH: 26.8 pg (ref 26.6–33.0)
MCHC: 32.4 g/dL (ref 31.5–35.7)
MCV: 83 fL (ref 79–97)
Monocytes Absolute: 0.5 10*3/uL (ref 0.1–0.9)
Monocytes: 7 %
Neutrophils Absolute: 4.3 10*3/uL (ref 1.4–7.0)
Neutrophils: 59 %
Platelets: 247 10*3/uL (ref 150–450)
RBC: 5.53 x10E6/uL (ref 4.14–5.80)
RDW: 11.9 % (ref 11.6–15.4)
WBC: 7.3 10*3/uL (ref 3.4–10.8)

## 2022-10-23 LAB — LIPID PANEL
Chol/HDL Ratio: 3.4 ratio (ref 0.0–5.0)
Cholesterol, Total: 178 mg/dL (ref 100–199)
HDL: 53 mg/dL (ref >39)
LDL Chol Calc (NIH): 111 mg/dL — ABNORMAL HIGH (ref 0–99)
Triglycerides: 76 mg/dL (ref 0–149)
VLDL Cholesterol Cal: 14 mg/dL (ref 5–40)

## 2022-10-23 LAB — PSA: Prostate Specific Ag, Serum: 2.1 ng/mL (ref 0.0–4.0)

## 2022-10-23 LAB — CMP14+EGFR
ALT: 14 IU/L (ref 0–44)
AST: 19 IU/L (ref 0–40)
Albumin: 4.6 g/dL (ref 4.1–5.1)
Alkaline Phosphatase: 54 IU/L (ref 44–121)
BUN/Creatinine Ratio: 10 (ref 9–20)
BUN: 13 mg/dL (ref 6–24)
Bilirubin Total: 1.4 mg/dL — ABNORMAL HIGH (ref 0.0–1.2)
CO2: 26 mmol/L (ref 20–29)
Calcium: 9.8 mg/dL (ref 8.7–10.2)
Chloride: 101 mmol/L (ref 96–106)
Creatinine, Ser: 1.34 mg/dL — ABNORMAL HIGH (ref 0.76–1.27)
Globulin, Total: 2.4 g/dL (ref 1.5–4.5)
Glucose: 87 mg/dL (ref 70–99)
Potassium: 4.5 mmol/L (ref 3.5–5.2)
Sodium: 140 mmol/L (ref 134–144)
Total Protein: 7 g/dL (ref 6.0–8.5)
eGFR: 65 mL/min/{1.73_m2} (ref >59)

## 2022-10-23 NOTE — Progress Notes (Signed)
Results sent through MyChart

## 2022-11-01 ENCOUNTER — Ambulatory Visit: Payer: Self-pay | Admitting: Surgery

## 2022-11-01 DIAGNOSIS — K4020 Bilateral inguinal hernia, without obstruction or gangrene, not specified as recurrent: Secondary | ICD-10-CM | POA: Diagnosis not present

## 2022-11-01 NOTE — H&P (Signed)
Carl Wallace [id number]   Referring Provider:  Ernst Breach, *   Subjective   Chief Complaint: New Consultation     History of Present Illness:    Very pleasant 47yo male with history of HTN, joint/back pain, who presents for evaluation of a left inguinal hernia. He saw Dr. Michaell Cowing for this in 2022 who noted left greater than right inguinal hernias and offered laparoscopic repair.  At that time due to scheduling issues with work, he did not pursue surgery.  Over time the left inguinal hernia has grown slightly and has become more bothersome.  Not painful per se.  Denies any GI issues or bladder issues other than more frequent urination.  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   Review of Systems: A complete review of systems was obtained from the patient.  I have reviewed this information and discussed as appropriate with the patient.  See HPI as well for other ROS.   Medical History: History reviewed. No pertinent past medical history.  There is no problem list on file for this patient.   History reviewed. No pertinent surgical history.   No Known Allergies  No current outpatient medications on file prior to visit.   No current facility-administered medications on file prior to visit.    Family History  Problem Relation Age of Onset   High blood pressure (Hypertension) Mother    Hyperlipidemia (Elevated cholesterol) Mother    High blood pressure (Hypertension) Brother    Hyperlipidemia (Elevated cholesterol) Brother      Social History   Tobacco Use  Smoking Status Never  Smokeless Tobacco Never     Social History   Socioeconomic History   Marital status: Married  Tobacco Use   Smoking status: Never   Smokeless tobacco: Never  Substance and Sexual Activity   Alcohol use:  Yes   Drug use: Never   Social Determinants of Health   Financial Resource Strain: Low Risk  (10/22/2022)   Received from [facility]   Overall Financial Resource Strain (CARDIA)    Difficulty of Paying Living Expenses: Not very hard  Food Insecurity: No Food Insecurity (10/22/2022)   Received from [facility]   Hunger Vital Sign    Worried About Running Out of Food in the Last Year: Never true    Ran Out of Food in the Last Year: Never true  Physical Activity: Sufficiently Active (10/22/2022)   Received from [facility]   Exercise Vital Sign    Days of Exercise per Week: 3 days    Minutes of Exercise per Session: 60 min  Stress: No Stress Concern Present (10/22/2022)   Received from [facility] - Occupational Stress Questionnaire    Feeling of Stress : Only a little  Social Connections: Socially Integrated (10/22/2022)   Received from [facility]   Social Connection and Isolation Panel [NHANES]    Frequency of Communication with Friends and Family: More than three times a week    Frequency of Social Gatherings with Friends and Family: Twice a week    Attends Religious Services: More than 4 times per year    Active Member of [employer] or Organizations: Yes    Attends Banker Meetings: More than 4 times per year  Marital Status: Married    Objective:    Vitals:   11/01/22 1448 11/01/22 1451  BP: (!) 138/98   Pulse: 68   Temp: 36.9 C (98.4 F)   SpO2: 98%   Weight: (!) 114.7 kg (252 lb 12.8 oz)   Height: 195.6 cm (6\' 5" )   PainSc:  0-No pain  PainLoc:  Abdomen    Body mass index is 29.98 kg/m.  Gen: A&Ox3, no distress  Chest: respiratory effort is normal.  Gastrointestinal: soft, nondistended, nontender.  Reducible moderate left inguinal hernia; right inguinal hernia present with Valsalva    Assessment and Plan:  Diagnoses and all orders for this visit:  Non-recurrent inguinal hernia without obstruction or  gangrene, unspecified laterality    We discussed the relevant anatomy and we discussed options for repair.  He wishes to have both the symptomatic left and the asymptomatic right inguinal hernias repaired.  I recommend a robotic approach and went over the technique of the procedure.  Discussed risks of bleeding, infection, pain, scarring, injury to structures in the area including nerves, blood vessels, bowel, bladder, risk of chronic pain, hernia recurrence, risk of seroma or hematoma, urinary retention, and risks of general anesthesia including cardiovascular, pulmonary, and thromboembolic complications.  We discussed typical postop recovery, timeline, and activity limitations.  Questions were welcomed and answered to the patient's satisfaction. Patient wishes to proceed with scheduling.   Tayla Panozzo Carlye Grippe, MD

## 2022-11-01 NOTE — H&P (View-Only) (Signed)
Carl Wallace [id number]   Referring Provider:  Ernst Breach, *   Subjective   Chief Complaint: New Consultation     History of Present Illness:    Very pleasant 47yo male with history of HTN, joint/back pain, who presents for evaluation of a left inguinal hernia. He saw Dr. Michaell Cowing for this in 2022 who noted left greater than right inguinal hernias and offered laparoscopic repair.  At that time due to scheduling issues with work, he did not pursue surgery.  Over time the left inguinal hernia has grown slightly and has become more bothersome.  Not painful per se.  Denies any GI issues or bladder issues other than more frequent urination.  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   Review of Systems: A complete review of systems was obtained from the patient.  I have reviewed this information and discussed as appropriate with the patient.  See HPI as well for other ROS.   Medical History: History reviewed. No pertinent past medical history.  There is no problem list on file for this patient.   History reviewed. No pertinent surgical history.   No Known Allergies  No current outpatient medications on file prior to visit.   No current facility-administered medications on file prior to visit.    Family History  Problem Relation Age of Onset   High blood pressure (Hypertension) Mother    Hyperlipidemia (Elevated cholesterol) Mother    High blood pressure (Hypertension) Brother    Hyperlipidemia (Elevated cholesterol) Brother      Social History   Tobacco Use  Smoking Status Never  Smokeless Tobacco Never     Social History   Socioeconomic History   Marital status: Married  Tobacco Use   Smoking status: Never   Smokeless tobacco: Never  Substance and Sexual Activity   Alcohol use:  Yes   Drug use: Never   Social Determinants of Health   Financial Resource Strain: Low Risk  (10/22/2022)   Received from [facility]   Overall Financial Resource Strain (CARDIA)    Difficulty of Paying Living Expenses: Not very hard  Food Insecurity: No Food Insecurity (10/22/2022)   Received from [facility]   Hunger Vital Sign    Worried About Running Out of Food in the Last Year: Never true    Ran Out of Food in the Last Year: Never true  Physical Activity: Sufficiently Active (10/22/2022)   Received from [facility]   Exercise Vital Sign    Days of Exercise per Week: 3 days    Minutes of Exercise per Session: 60 min  Stress: No Stress Concern Present (10/22/2022)   Received from [facility] - Occupational Stress Questionnaire    Feeling of Stress : Only a little  Social Connections: Socially Integrated (10/22/2022)   Received from [facility]   Social Connection and Isolation Panel [NHANES]    Frequency of Communication with Friends and Family: More than three times a week    Frequency of Social Gatherings with Friends and Family: Twice a week    Attends Religious Services: More than 4 times per year    Active Member of [employer] or Organizations: Yes    Attends Banker Meetings: More than 4 times per year  Marital Status: Married    Objective:    Vitals:   11/01/22 1448 11/01/22 1451  BP: (!) 138/98   Pulse: 68   Temp: 36.9 C (98.4 F)   SpO2: 98%   Weight: (!) 114.7 kg (252 lb 12.8 oz)   Height: 195.6 cm (6\' 5" )   PainSc:  0-No pain  PainLoc:  Abdomen    Body mass index is 29.98 kg/m.  Gen: A&Ox3, no distress  Chest: respiratory effort is normal.  Gastrointestinal: soft, nondistended, nontender.  Reducible moderate left inguinal hernia; right inguinal hernia present with Valsalva    Assessment and Plan:  Diagnoses and all orders for this visit:  Non-recurrent inguinal hernia without obstruction or  gangrene, unspecified laterality    We discussed the relevant anatomy and we discussed options for repair.  He wishes to have both the symptomatic left and the asymptomatic right inguinal hernias repaired.  I recommend a robotic approach and went over the technique of the procedure.  Discussed risks of bleeding, infection, pain, scarring, injury to structures in the area including nerves, blood vessels, bowel, bladder, risk of chronic pain, hernia recurrence, risk of seroma or hematoma, urinary retention, and risks of general anesthesia including cardiovascular, pulmonary, and thromboembolic complications.  We discussed typical postop recovery, timeline, and activity limitations.  Questions were welcomed and answered to the patient's satisfaction. Patient wishes to proceed with scheduling.   CHELSEA Carlye Grippe, MD

## 2022-11-07 DIAGNOSIS — Z1211 Encounter for screening for malignant neoplasm of colon: Secondary | ICD-10-CM | POA: Diagnosis not present

## 2022-11-07 DIAGNOSIS — E669 Obesity, unspecified: Secondary | ICD-10-CM | POA: Diagnosis not present

## 2022-11-11 NOTE — Patient Instructions (Signed)
SURGICAL WAITING ROOM VISITATION Patients having surgery or a procedure may have no more than 2 support people in the waiting area - these visitors may rotate in the visitor waiting room.   Due to an increase in RSV and influenza rates and associated hospitalizations, children ages 30 and under may not visit patients in [facility] hospitals. If the patient needs to stay at the hospital during part of their recovery, the visitor guidelines for inpatient rooms apply.  PRE-OP VISITATION  Pre-op nurse will coordinate an appropriate time for 1 support person to accompany the patient in pre-op.  This support person may not rotate.  This visitor will be contacted when the time is appropriate for the visitor to come back in the pre-op area.  Please refer to the [facility] website for the visitor guidelines for Inpatients (after your surgery is over and you are in a regular room).  You are not required to quarantine at this time prior to your surgery. However, you must do this: Hand Hygiene often Do NOT share personal items Notify your provider if you are in close contact with someone who has COVID or you develop fever 100.4 or greater, new onset of sneezing, cough, sore throat, shortness of breath or body aches.  If you test positive for Covid or have been in contact with anyone that has tested positive in the last 10 days please notify you surgeon.    Your procedure is scheduled on:  Tuesday  November 26, 2022  Report to [facility] Main Entrance: [place] entrance where the [employer] is available.   Report to admitting at:  11:15   AM  Call this number if you have any questions or problems the morning of surgery [phone]  DO NOT EAT OR DRINK ANYTHING AFTER MIDNIGHT THE NIGHT PRIOR TO YOUR SURGERY / PROCEDURE.   FOLLOW BOWEL PREP AND ANY ADDITIONAL PRE OP INSTRUCTIONS YOU RECEIVED FROM YOUR SURGEON'S OFFICE!!!   Oral Hygiene is also important to reduce your risk of infection.         Remember - BRUSH YOUR TEETH THE MORNING OF SURGERY WITH YOUR REGULAR TOOTHPASTE  Do NOT smoke after Midnight the night before surgery.  Take ONLY these medicines the morning of surgery with A SIP OF WATER: none   If You have been diagnosed with Sleep Apnea - Bring CPAP mask and tubing day of surgery. We will provide you with a CPAP machine on the day of your surgery.                   You may not have any metal on your body including  jewelry, and body piercing  Do not wear lotions, powders, cologne, or deodorant  Men may shave face and neck.  Contacts, Hearing Aids, dentures or bridgework may not be worn into surgery. DENTURES WILL BE REMOVED PRIOR TO SURGERY PLEASE DO NOT APPLY "Poly grip" OR ADHESIVES!!!  Patients discharged on the day of surgery will not be allowed to drive home.  Someone NEEDS to stay with you for the first 24 hours after anesthesia.  Do not bring your home medications to the hospital. The Pharmacy will dispense medications listed on your medication list to you during your admission in the Hospital.  Special Instructions: Bring a copy of your healthcare power of attorney and living will documents the day of surgery, if you wish to have them scanned into your [facility] Records- EPIC  Please read over the following fact  sheets you were given: IF YOU HAVE QUESTIONS ABOUT YOUR PRE-OP INSTRUCTIONS, PLEASE CALL [phone] Joyce Gross)   [facility] - Preparing for Surgery Before surgery, you can play an important role.  Because skin is not sterile, your skin needs to be as free of germs as possible.  You can reduce the number of germs on your skin by washing with CHG (chlorahexidine gluconate) soap before surgery.  CHG is an antiseptic cleaner which kills germs and bonds with the skin to continue killing germs even after washing. Please DO NOT use if you have an allergy to CHG or antibacterial soaps.  If your skin becomes reddened/irritated stop using the CHG and  inform your nurse when you arrive at Short Stay. Do not shave (including legs and underarms) for at least 48 hours prior to the first CHG shower.  You may shave your face/neck.  Please follow these instructions carefully:  1.  Shower with CHG Soap the night before surgery and the  morning of surgery.  2.  If you choose to wash your hair, wash your hair first as usual with your normal  shampoo.  3.  After you shampoo, rinse your hair and body thoroughly to remove the shampoo.                             4.  Use CHG as you would any other liquid soap.  You can apply chg directly to the skin and wash.  Gently with a scrungie or clean washcloth.  5.  Apply the CHG Soap to your body ONLY FROM THE NECK DOWN.   Do not use on face/ open                           Wound or open sores. Avoid contact with eyes, ears mouth and genitals (private parts).                       Wash face,  Genitals (private parts) with your normal soap.             6.  Wash thoroughly, paying special attention to the area where your  surgery  will be performed.  7.  Thoroughly rinse your body with warm water from the neck down.  8.  DO NOT shower/wash with your normal soap after using and rinsing off the CHG Soap.            9.  Pat yourself dry with a clean towel.            10.  Wear clean pajamas.            11.  Place clean sheets on your bed the night of your first shower and do not  sleep with pets.  ON THE DAY OF SURGERY : Do not apply any lotions/deodorants the morning of surgery.  Please wear clean clothes to the hospital/surgery center.    FAILURE TO FOLLOW THESE INSTRUCTIONS MAY RESULT IN THE CANCELLATION OF YOUR SURGERY  PATIENT SIGNATURE_________________________________  NURSE SIGNATURE__________________________________  ________________________________________________________________________

## 2022-11-11 NOTE — Progress Notes (Signed)
COVID Vaccine received:  []  No [x]  Yes Date of any COVID positive Test in last 90 days:  PCP - Crosby Oyster, PA-C Cardiologist -   Chest x-ray -  EKG -  do at PST Stress Test -  ECHO -  Cardiac Cath -   PCR screen: []  Ordered & Completed           []   No Order but Needs PROFEND           [x]   N/A for this surgery  Surgery Plan:  [x]  Ambulatory                            []  Outpatient in bed                            []  Admit  Anesthesia:    [x]  General  []  Spinal                           []   Choice []   MAC  Bowel Prep - []  No  []   Yes ______  Pacemaker / ICD device [x]  No []  Yes   Spinal Cord Stimulator:[x]  No []  Yes       History of Sleep Apnea? []  No [x]  Yes   mild CPAP used?- [x]  No []  Yes    Does the patient monitor blood sugar?          []  No []  Yes  [x]  N/A  Patient has: [x]  NO Hx DM   []  Pre-DM                 []  DM1  []   DM2 Does patient have a [employer] or [employer]om? []  No []  Yes   Fasting Blood Sugar Ranges-  Checks Blood Sugar _____ times a day  Blood Thinner / Instructions: none Aspirin Instructions:  none  ERAS Protocol Ordered: [x]  No  []  Yes Patient is to be NPO after: Midnight prior  Comments:   Activity level: Patient is able / unable to climb a flight of stairs without difficulty; []  No CP  []  No SOB, but would have ___   Patient can / can not perform ADLs without assistance.   Anesthesia review: OSA- Mild, no CPAP,  HTN (no Meds)  Patient denies shortness of breath, fever, cough and chest pain at PAT appointment.  Patient verbalized understanding and agreement to the Pre-Surgical Instructions that were given to them at this PAT appointment. Patient was also educated of the need to review these PAT instructions again prior to his surgery.I reviewed the appropriate phone numbers to call if they have any and questions or concerns.

## 2022-11-13 ENCOUNTER — Other Ambulatory Visit: Payer: Self-pay

## 2022-11-13 ENCOUNTER — Encounter (HOSPITAL_COMMUNITY): Payer: Self-pay

## 2022-11-13 ENCOUNTER — Encounter (HOSPITAL_COMMUNITY)
Admission: RE | Admit: 2022-11-13 | Discharge: 2022-11-13 | Disposition: A | Discharge status: Home / Self Care | Payer: BC Managed Care – PPO | Source: Ambulatory Visit | Attending: Surgery | Admitting: Surgery

## 2022-11-13 VITALS — BP 146/100 | HR 63 | Temp 98.4°F | Resp 14 | Ht 77.0 in | Wt 255.0 lb

## 2022-11-13 DIAGNOSIS — I10 Essential (primary) hypertension: Secondary | ICD-10-CM | POA: Diagnosis not present

## 2022-11-13 DIAGNOSIS — Z01818 Encounter for other preprocedural examination: Secondary | ICD-10-CM | POA: Insufficient documentation

## 2022-11-13 LAB — BASIC METABOLIC PANEL
Anion gap: 8 (ref 5–15)
BUN: 18 mg/dL (ref 6–20)
CO2: 27 mmol/L (ref 22–32)
Calcium: 8.7 mg/dL — ABNORMAL LOW (ref 8.9–10.3)
Chloride: 102 mmol/L (ref 98–111)
Creatinine, Ser: 1.17 mg/dL (ref 0.61–1.24)
GFR, Estimated: >60 mL/min (ref >60)
Glucose, Bld: 96 mg/dL (ref 70–99)
Potassium: 3.5 mmol/L (ref 3.5–5.1)
Sodium: 137 mmol/L (ref 135–145)

## 2022-11-13 LAB — CBC
HCT: 45.4 % (ref 39.0–52.0)
Hemoglobin: 14.5 g/dL (ref 13.0–17.0)
MCH: 27.5 pg (ref 26.0–34.0)
MCHC: 31.9 g/dL (ref 30.0–36.0)
MCV: 86 fL (ref 80.0–100.0)
Platelets: 246 10*3/uL (ref 150–400)
RBC: 5.28 MIL/uL (ref 4.22–5.81)
RDW: 12.9 % (ref 11.5–15.5)
WBC: 8.1 10*3/uL (ref 4.0–10.5)
nRBC: 0 % (ref 0.0–0.2)

## 2022-11-20 DIAGNOSIS — K635 Polyp of colon: Secondary | ICD-10-CM | POA: Diagnosis not present

## 2022-11-20 DIAGNOSIS — K6289 Other specified diseases of anus and rectum: Secondary | ICD-10-CM | POA: Diagnosis not present

## 2022-11-20 DIAGNOSIS — D125 Benign neoplasm of sigmoid colon: Secondary | ICD-10-CM | POA: Diagnosis not present

## 2022-11-20 DIAGNOSIS — Z1211 Encounter for screening for malignant neoplasm of colon: Secondary | ICD-10-CM | POA: Diagnosis not present

## 2022-11-20 DIAGNOSIS — K5730 Diverticulosis of large intestine without perforation or abscess without bleeding: Secondary | ICD-10-CM | POA: Diagnosis not present

## 2022-11-20 DIAGNOSIS — K648 Other hemorrhoids: Secondary | ICD-10-CM | POA: Diagnosis not present

## 2022-11-20 LAB — HM COLONOSCOPY

## 2022-11-26 ENCOUNTER — Other Ambulatory Visit: Payer: Self-pay

## 2022-11-26 ENCOUNTER — Encounter (HOSPITAL_COMMUNITY)
Admission: RE | Disposition: A | Discharge status: Home / Self Care | Payer: Self-pay | Source: Home / Self Care | Attending: Surgery

## 2022-11-26 ENCOUNTER — Ambulatory Visit (HOSPITAL_COMMUNITY): Payer: BC Managed Care – PPO | Admitting: Certified Registered Nurse Anesthetist

## 2022-11-26 ENCOUNTER — Encounter (HOSPITAL_COMMUNITY): Payer: Self-pay | Admitting: Surgery

## 2022-11-26 ENCOUNTER — Ambulatory Visit (HOSPITAL_COMMUNITY)
Admission: RE | Admit: 2022-11-26 | Discharge: 2022-11-26 | Disposition: A | Discharge status: Home / Self Care | Payer: BC Managed Care – PPO | Attending: Surgery | Admitting: Surgery

## 2022-11-26 ENCOUNTER — Ambulatory Visit (HOSPITAL_COMMUNITY): Payer: BC Managed Care – PPO | Admitting: Medical

## 2022-11-26 DIAGNOSIS — K4020 Bilateral inguinal hernia, without obstruction or gangrene, not specified as recurrent: Secondary | ICD-10-CM | POA: Diagnosis not present

## 2022-11-26 DIAGNOSIS — Z8249 Family history of ischemic heart disease and other diseases of the circulatory system: Secondary | ICD-10-CM | POA: Insufficient documentation

## 2022-11-26 DIAGNOSIS — I10 Essential (primary) hypertension: Secondary | ICD-10-CM | POA: Diagnosis not present

## 2022-11-26 HISTORY — PX: XI ROBOTIC ASSISTED INGUINAL HERNIA REPAIR WITH MESH: SHX6706

## 2022-11-26 SURGERY — REPAIR, HERNIA, INGUINAL, ROBOT-ASSISTED, LAPAROSCOPIC, USING MESH
Anesthesia: General | Laterality: Bilateral

## 2022-11-26 MED ORDER — KETOROLAC TROMETHAMINE 30 MG/ML IJ SOLN
30.0000 mg | Freq: Once | INTRAMUSCULAR | Status: AC | PRN
  Administered 2022-11-26: 30 mg via INTRAVENOUS

## 2022-11-26 MED ORDER — OXYCODONE HCL 5 MG PO TABS: 5.0000 mg | ORAL_TABLET | Freq: Once | ORAL | Status: DC | PRN

## 2022-11-26 MED ORDER — LIDOCAINE 2% (20 MG/ML) 5 ML SYRINGE
INTRAMUSCULAR | Status: DC | PRN
  Administered 2022-11-26: 100 mg via INTRAVENOUS

## 2022-11-26 MED ORDER — ROCURONIUM BROMIDE 10 MG/ML (PF) SYRINGE
PREFILLED_SYRINGE | INTRAVENOUS | Status: DC | PRN
  Administered 2022-11-26: 20 mg via INTRAVENOUS
  Administered 2022-11-26: 70 mg via INTRAVENOUS
  Administered 2022-11-26: 30 mg via INTRAVENOUS

## 2022-11-26 MED ORDER — CEFAZOLIN SODIUM-DEXTROSE 2-4 GM/100ML-% IV SOLN
2.0000 g | INTRAVENOUS | Status: AC
  Administered 2022-11-26: 2 g via INTRAVENOUS
  Filled 2022-11-26: qty 100

## 2022-11-26 MED ORDER — ONDANSETRON HCL 4 MG/2ML IJ SOLN: 4.0000 mg | Freq: Once | INTRAMUSCULAR | Status: DC | PRN

## 2022-11-26 MED ORDER — 0.9 % SODIUM CHLORIDE (POUR BTL) OPTIME
TOPICAL | Status: DC | PRN
Start: 2022-11-26 — End: 2022-11-26
  Administered 2022-11-26: 1000 mL

## 2022-11-26 MED ORDER — DEXAMETHASONE SODIUM PHOSPHATE 10 MG/ML IJ SOLN
INTRAMUSCULAR | Status: DC | PRN
  Administered 2022-11-26: 10 mg via INTRAVENOUS

## 2022-11-26 MED ORDER — BUPIVACAINE LIPOSOME 1.3 % IJ SUSP
INTRAMUSCULAR | Status: AC
  Filled 2022-11-26: qty 20

## 2022-11-26 MED ORDER — PROPOFOL 10 MG/ML IV BOLUS
INTRAVENOUS | Status: AC
  Filled 2022-11-26: qty 20

## 2022-11-26 MED ORDER — CHLORHEXIDINE GLUCONATE 4 % EX SOLN: 60.0000 mL | Freq: Once | CUTANEOUS | Status: DC

## 2022-11-26 MED ORDER — ACETAMINOPHEN 500 MG PO TABS
1000.0000 mg | ORAL_TABLET | ORAL | Status: AC
  Administered 2022-11-26: 1000 mg via ORAL
  Filled 2022-11-26: qty 2

## 2022-11-26 MED ORDER — FENTANYL CITRATE (PF) 100 MCG/2ML IJ SOLN
INTRAMUSCULAR | Status: DC | PRN
  Administered 2022-11-26 (×3): 50 ug via INTRAVENOUS
  Administered 2022-11-26: 100 ug via INTRAVENOUS

## 2022-11-26 MED ORDER — OXYCODONE HCL 5 MG PO TABS: 5.0000 mg | ORAL_TABLET | Freq: Three times a day (TID) | ORAL | 0 refills | Status: AC | PRN

## 2022-11-26 MED ORDER — BUPIVACAINE-EPINEPHRINE (PF) 0.25% -1:200000 IJ SOLN
INTRAMUSCULAR | Status: DC | PRN
  Administered 2022-11-26: 50 mL

## 2022-11-26 MED ORDER — ONDANSETRON HCL 4 MG/2ML IJ SOLN
INTRAMUSCULAR | Status: DC | PRN
  Administered 2022-11-26: 4 mg via INTRAVENOUS

## 2022-11-26 MED ORDER — MIDAZOLAM HCL 5 MG/5ML IJ SOLN
INTRAMUSCULAR | Status: DC | PRN
  Administered 2022-11-26: 2 mg via INTRAVENOUS

## 2022-11-26 MED ORDER — CHLORHEXIDINE GLUCONATE 0.12 % MT SOLN
15.0000 mL | Freq: Once | OROMUCOSAL | Status: AC
  Administered 2022-11-26: 15 mL via OROMUCOSAL

## 2022-11-26 MED ORDER — ROCURONIUM BROMIDE 10 MG/ML (PF) SYRINGE
PREFILLED_SYRINGE | INTRAVENOUS | Status: AC
  Filled 2022-11-26: qty 10

## 2022-11-26 MED ORDER — BUPIVACAINE-EPINEPHRINE 0.25% -1:200000 IJ SOLN
INTRAMUSCULAR | Status: AC
  Filled 2022-11-26: qty 1

## 2022-11-26 MED ORDER — KETAMINE HCL 50 MG/5ML IJ SOSY
PREFILLED_SYRINGE | INTRAMUSCULAR | Status: AC
  Filled 2022-11-26: qty 5

## 2022-11-26 MED ORDER — GABAPENTIN 300 MG PO CAPS
300.0000 mg | ORAL_CAPSULE | ORAL | Status: AC
  Administered 2022-11-26: 300 mg via ORAL
  Filled 2022-11-26: qty 1

## 2022-11-26 MED ORDER — DEXAMETHASONE SODIUM PHOSPHATE 10 MG/ML IJ SOLN
INTRAMUSCULAR | Status: AC
  Filled 2022-11-26: qty 1

## 2022-11-26 MED ORDER — LIDOCAINE HCL (PF) 2 % IJ SOLN
INTRAMUSCULAR | Status: AC
  Filled 2022-11-26: qty 5

## 2022-11-26 MED ORDER — ONDANSETRON HCL 4 MG/2ML IJ SOLN
INTRAMUSCULAR | Status: AC
  Filled 2022-11-26: qty 2

## 2022-11-26 MED ORDER — FENTANYL CITRATE (PF) 250 MCG/5ML IJ SOLN
INTRAMUSCULAR | Status: AC
  Filled 2022-11-26: qty 5

## 2022-11-26 MED ORDER — KETOROLAC TROMETHAMINE 30 MG/ML IJ SOLN
INTRAMUSCULAR | Status: AC
  Filled 2022-11-26: qty 1

## 2022-11-26 MED ORDER — ORAL CARE MOUTH RINSE: 15.0000 mL | Freq: Once | OROMUCOSAL | Status: AC

## 2022-11-26 MED ORDER — MIDAZOLAM HCL 2 MG/2ML IJ SOLN
INTRAMUSCULAR | Status: AC
  Filled 2022-11-26: qty 2

## 2022-11-26 MED ORDER — OXYCODONE HCL 5 MG/5ML PO SOLN: 5.0000 mg | Freq: Once | ORAL | Status: DC | PRN

## 2022-11-26 MED ORDER — DOCUSATE SODIUM 100 MG PO CAPS: 100.0000 mg | ORAL_CAPSULE | Freq: Two times a day (BID) | ORAL | 0 refills | Status: AC

## 2022-11-26 MED ORDER — SUGAMMADEX SODIUM 200 MG/2ML IV SOLN
INTRAVENOUS | Status: DC | PRN
  Administered 2022-11-26: 400 mg via INTRAVENOUS

## 2022-11-26 MED ORDER — PROPOFOL 10 MG/ML IV BOLUS
INTRAVENOUS | Status: DC | PRN
Start: 2022-11-26 — End: 2022-11-26
  Administered 2022-11-26: 200 mg via INTRAVENOUS

## 2022-11-26 MED ORDER — FENTANYL CITRATE PF 50 MCG/ML IJ SOSY
25.0000 ug | PREFILLED_SYRINGE | INTRAMUSCULAR | Status: DC | PRN
  Administered 2022-11-26 (×2): 50 ug via INTRAVENOUS

## 2022-11-26 MED ORDER — FENTANYL CITRATE PF 50 MCG/ML IJ SOSY
PREFILLED_SYRINGE | INTRAMUSCULAR | Status: AC
  Filled 2022-11-26: qty 2

## 2022-11-26 MED ORDER — BUPIVACAINE LIPOSOME 1.3 % IJ SUSP: 20.0000 mL | Freq: Once | INTRAMUSCULAR | Status: DC

## 2022-11-26 MED ORDER — LACTATED RINGERS IV SOLN: INTRAVENOUS | Status: DC

## 2022-11-26 SURGICAL SUPPLY — 55 items
ANTIFOG SOL W/FOAM PAD STRL (MISCELLANEOUS) ×1
APL PRP STRL LF DISP 70% ISPRP (MISCELLANEOUS) ×1
APL SWBSTK 6 STRL LF DISP (MISCELLANEOUS) ×2
APPLICATOR COTTON TIP 6 STRL (MISCELLANEOUS) ×2 IMPLANT
APPLICATOR COTTON TIP 6IN STRL (MISCELLANEOUS) ×2
BAG COUNTER SPONGE SURGICOUNT (BAG) IMPLANT
BAG SPNG CNTER NS LX DISP (BAG)
BLADE SURG SZ11 CARB STEEL (BLADE) ×1 IMPLANT
CHLORAPREP W/TINT 26 (MISCELLANEOUS) ×1 IMPLANT
COVER SURGICAL LIGHT HANDLE (MISCELLANEOUS) ×1 IMPLANT
COVER TIP SHEARS 8 DVNC (MISCELLANEOUS) ×1 IMPLANT
DRAPE ARM DVNC X/XI (DISPOSABLE) ×4 IMPLANT
DRAPE COLUMN DVNC XI (DISPOSABLE) ×1 IMPLANT
DRIVER NDL LRG 8 DVNC XI (INSTRUMENTS) ×1 IMPLANT
DRIVER NDL MEGA SUTCUT DVNCXI (INSTRUMENTS) ×2 IMPLANT
DRIVER NDLE LRG 8 DVNC XI (INSTRUMENTS) IMPLANT
DRIVER NDLE MEGA SUTCUT DVNCXI (INSTRUMENTS) ×1 IMPLANT
ELECT REM PT RETURN 15FT ADLT (MISCELLANEOUS) ×1 IMPLANT
FORCEPS BPLR FENES DVNC XI (FORCEP) IMPLANT
GLOVE BIO SURGEON STRL SZ 6 (GLOVE) ×2 IMPLANT
GLOVE INDICATOR 6.5 STRL GRN (GLOVE) ×2 IMPLANT
GLOVE SS BIOGEL STRL SZ 6 (GLOVE) ×1 IMPLANT
GOWN STRL REUS W/ TWL LRG LVL3 (GOWN DISPOSABLE) ×2 IMPLANT
GOWN STRL REUS W/ TWL XL LVL3 (GOWN DISPOSABLE) IMPLANT
GOWN STRL REUS W/TWL LRG LVL3 (GOWN DISPOSABLE) ×2
GOWN STRL REUS W/TWL XL LVL3 (GOWN DISPOSABLE)
GRASPER TIP-UP FEN DVNC XI (INSTRUMENTS) ×1 IMPLANT
IRRIG SUCT STRYKERFLOW 2 WTIP (MISCELLANEOUS)
IRRIGATION SUCT STRKRFLW 2 WTP (MISCELLANEOUS) IMPLANT
KIT BASIN OR (CUSTOM PROCEDURE TRAY) ×1 IMPLANT
KIT TURNOVER KIT A (KITS) IMPLANT
MESH 3DMAX MID 4X6 LT LRG (Mesh General) IMPLANT
MESH 3DMAX MID 4X6 RT LRG (Mesh General) IMPLANT
NDL HYPO 22X1.5 SAFETY MO (MISCELLANEOUS) ×1 IMPLANT
NDL INSUFFLATION 14GA 120MM (NEEDLE) ×1 IMPLANT
NEEDLE HYPO 22X1.5 SAFETY MO (MISCELLANEOUS) ×1 IMPLANT
NEEDLE INSUFFLATION 14GA 120MM (NEEDLE) ×1 IMPLANT
PACK CARDIOVASCULAR III (CUSTOM PROCEDURE TRAY) ×1 IMPLANT
PAD POSITIONING PINK XL (MISCELLANEOUS) ×1 IMPLANT
SCISSORS MNPLR CVD DVNC XI (INSTRUMENTS) ×1 IMPLANT
SEAL UNIV 5-12 XI (MISCELLANEOUS) ×3 IMPLANT
SOL ELECTROSURG ANTI STICK (MISCELLANEOUS) ×1
SOLUTION ANTFG W/FOAM PAD STRL (MISCELLANEOUS) ×1 IMPLANT
SOLUTION ELECTROSURG ANTI STCK (MISCELLANEOUS) ×1 IMPLANT
SPIKE FLUID TRANSFER (MISCELLANEOUS) ×1 IMPLANT
SUT MNCRL AB 4-0 PS2 18 (SUTURE) ×1 IMPLANT
SUT VIC AB 3-0 SH 27 (SUTURE) ×2
SUT VIC AB 3-0 SH 27XBRD (SUTURE) ×1 IMPLANT
SUT VLOC 180 2-0 6IN GS21 (SUTURE) ×1 IMPLANT
SUT VLOC 3-0 9IN GRN (SUTURE) IMPLANT
SYR 10ML LL (SYRINGE) ×1 IMPLANT
SYR 20ML LL LF (SYRINGE) ×1 IMPLANT
TOWEL OR 17X26 10 PK STRL BLUE (TOWEL DISPOSABLE) ×1 IMPLANT
TOWEL OR NON WOVEN STRL DISP B (DISPOSABLE) ×1 IMPLANT
TUBING INSUFFLATION 10FT LAP (TUBING) ×1 IMPLANT

## 2022-11-26 NOTE — Anesthesia Preprocedure Evaluation (Signed)
Anesthesia Evaluation  Patient identified by MRN, date of birth, ID band Patient awake    Reviewed: Allergy & Precautions, H&P , NPO status , Patient's Chart, lab work & pertinent test results  Airway Mallampati: II  TM Distance: >3 FB Neck ROM: Full    Dental no notable dental hx.    Pulmonary neg pulmonary ROS   Pulmonary exam normal breath sounds clear to auscultation       Cardiovascular hypertension, Normal cardiovascular exam Rhythm:Regular Rate:Normal     Neuro/Psych negative neurological ROS  negative psych ROS   GI/Hepatic negative GI ROS, Neg liver ROS,,,  Endo/Other  negative endocrine ROS    Renal/GU negative Renal ROS  negative genitourinary   Musculoskeletal negative musculoskeletal ROS (+)    Abdominal   Peds negative pediatric ROS (+)  Hematology negative hematology ROS (+)   Anesthesia Other Findings   Reproductive/Obstetrics negative OB ROS                             Anesthesia Physical Anesthesia Plan  ASA: 2  Anesthesia Plan: General   Post-op Pain Management: Tylenol PO (pre-op)*   Induction: Intravenous  PONV Risk Score and Plan: 2 and Ondansetron, Dexamethasone and Treatment may vary due to age or medical condition  Airway Management Planned: Oral ETT  Additional Equipment:   Intra-op Plan:   Post-operative Plan: Extubation in OR  Informed Consent: I have reviewed the patients History and Physical, chart, labs and discussed the procedure including the risks, benefits and alternatives for the proposed anesthesia with the patient or authorized representative who has indicated his/her understanding and acceptance.     Dental advisory given  Plan Discussed with: CRNA and Surgeon  Anesthesia Plan Comments:        Anesthesia Quick Evaluation

## 2022-11-26 NOTE — Anesthesia Procedure Notes (Signed)
Procedure Name: Intubation Date/Time: 11/26/2022 1:07 PM  Performed by: Orest Dikes, CRNAPre-anesthesia Checklist: Patient identified, Emergency Drugs available, Suction available and Patient being monitored Patient Re-evaluated:Patient Re-evaluated prior to induction Oxygen Delivery Method: Circle system utilized Preoxygenation: Pre-oxygenation with 100% oxygen Induction Type: IV induction Ventilation: Mask ventilation without difficulty Laryngoscope Size: Glidescope and 4 Grade View: Grade I Tube type: Oral Tube size: 7.5 mm Number of attempts: 2 Airway Equipment and Method: Stylet Placement Confirmation: ETT inserted through vocal cords under direct vision, positive ETCO2 and breath sounds checked- equal and bilateral Secured at: 24 cm Tube secured with: Tape Dental Injury: Teeth and Oropharynx as per pre-operative assessment  Difficulty Due To: Difficulty was unanticipated, Difficult Airway- due to large tongue and Difficult Airway- due to anterior larynx Comments: DL x 1 with MAC 4 via CRNA-poor view, Dr Okey Dupre attempted Bougie placement but unsuccessful. Glidescope 4 utilized with Grade 1 view. Would recommend use of Glidescope in future.

## 2022-11-26 NOTE — Anesthesia Postprocedure Evaluation (Signed)
Anesthesia Post Note  Patient: Carl Wallace  Procedure(s) Performed: ROBOTIC BILATERAL INGUINAL HERNIA REPAIRS WITH MESH (Bilateral)     Patient location during evaluation: PACU Anesthesia Type: General Level of consciousness: awake and alert Pain management: pain level controlled Vital Signs Assessment: post-procedure vital signs reviewed and stable Respiratory status: spontaneous breathing, nonlabored ventilation, respiratory function stable and patient connected to nasal cannula oxygen Cardiovascular status: blood pressure returned to baseline and stable Postop Assessment: no apparent nausea or vomiting Anesthetic complications: yes  Encounter Notable Events  Notable Event Outcome Phase Comment  Difficult to intubate - unexpected  Intraprocedure Filed from anesthesia note documentation.    Last Vitals:  Vitals:   11/26/22 1545 11/26/22 1600  BP: (!) 150/107 (!) 151/103  Pulse: 60 60  Resp: 12 13  Temp:    SpO2: 100% 100%    Last Pain:  Vitals:   11/26/22 1600  TempSrc:   PainSc: 0-No pain                 Ori Kreiter S

## 2022-11-26 NOTE — Op Note (Signed)
Operative Note  Zyheim Cords  [id number]  [id number]  11/26/2022   Surgeon: Phylliss Blakes MD   Procedure performed: Robotic bilateral inguinal hernia repair with mesh    Preop diagnosis: bilateral inguinal hernias  Post-op diagnosis/intraop findings: bilateral direct inguinal hernia   Specimens: no Retained items: no  EBL: minimal cc Complications: none   Description of procedure: After confirming informed consent the patient was taken to the operating room and placed supine on the operating room table where general endotracheal anesthesia was initiated, preoperative antibiotics were administered, SCDs applied, foley inserted and a formal timeout was performed.  Foley catheter was inserted which is removed at the end of the case.  The abdomen was prepped and draped in usual sterile fashion. Peritoneal access was gained with a left subcostal Veress needle and insufflation to 15 mmHg ensued without incident.  A periumbilical 8 mm robotic trocar was then inserted followed by the camera and the abdominal cavity was inspected and confirmed to be free of any injury from our entry.  Bilateral laparoscopic assisted tap blocks were performed with Exparel mixed with quarter percent Marcaine with epinephrine and bilateral 8 mm robotic trocars were placed under direct visualization.  The patient was then placed in Trendelenburg and the pelvis inspected; he was confirmed to have bilateral direct inguinal hernias, left greater than right.  At this point the robot was docked and all instruments inserted under direct visualization.  Beginning on the patient's left (symptomatic) side a peritoneal flap was developed spanning from the medial umbilical ligament to the anterior superior iliac spine with a combination of cautery and blunt dissection.  The space of Retzius was entered and carefully bluntly developed creating a medial space for the mesh.  The Cooper's ligament was exposed as well as 2 cm inferior to  this.  The direct hernia sac was reduced intact and continued dissection ensued until we had parietal lysed the vas deferens and gonadal vessels as well as developed adequate space for the mesh inferiorly and laterally.  The iliac vessels were partially visible and we had excellent exposure of the myopectineal orifice including the indirect, direct, and femoral spaces.  A Bard 3D max mid weight left-sided mesh was then inserted with excellent overlap of the hernia defect and this was secured to the Cooper's ligament as well as superiorly on either side of the inferior epigastric vessels with interrupted 3-0 Vicryl sutures.  The field was inspected and hemostasis was excellent.  The peritoneal flap was then brought back up to cover the mesh ensuring that the mesh remained flush against the abdominal wall without any buckling or folding of the inferior edge while doing so.  The peritoneum was closed with a running imbricating 2-0 V lock suture.  On completion there is no exposed mesh and the peritoneum is intact.  We then turned to the right side where in an identical fashion,a peritoneal flap was developed spanning from the medial umbilical ligament to the anterior superior iliac spine with a combination of cautery and blunt dissection.  The space of Retzius was entered and carefully bluntly developed creating a medial space for the mesh.  The Cooper's ligament was exposed as well as 2 cm inferior to this.  The direct hernia sac was reduced intact and continued dissection ensued until we had parietal lysed the vas deferens and gonadal vessels as well as developed adequate space for the mesh inferiorly and laterally.  The iliac vessels were partially visible and we had excellent exposure of the  myopectineal orifice including the indirect, direct, and femoral spaces.  A Bard 3D max mid weight left-sided mesh was then inserted with excellent overlap of the hernia defect and this was secured to the Cooper's ligament as  well as superiorly on either side of the inferior epigastric vessels with interrupted 3-0 Vicryl sutures.  The field was inspected and hemostasis was excellent.  The peritoneal flap was then brought back up to cover the mesh ensuring that the mesh remained flush against the abdominal wall without any buckling or folding of the inferior edge or elsewhere while doing so.  The peritoneum was closed with a running imbricating 2-0 V lock suture.  On completion there is no exposed mesh and the peritoneum is intact.   At this point all sutures were removed and the abdominal cavity was once again inspected.  Hemostasis was excellent.  The robotic instruments were removed and the robot was undocked.  The abdomen was desufflated and the trocars removed. The skin incisions were closed with subcuticular 4-0 Monocryl and Dermabond. The patient was then awakened, extubated and taken to PACU in stable condition.  All counts were correct at the completion of the case.

## 2022-11-26 NOTE — Transfer of Care (Signed)
Immediate Anesthesia Transfer of Care Note  Patient: Carl Wallace  Procedure(s) Performed: Procedure(s): ROBOTIC BILATERAL INGUINAL HERNIA REPAIRS WITH MESH (Bilateral)  Patient Location: PACU  Anesthesia Type:General  Level of Consciousness:  sedated, patient cooperative and responds to stimulation  Airway & Oxygen Therapy:Patient Spontanous Breathing and Patient connected to face mask oxgen  Post-op Assessment:  Report given to PACU RN and Post -op Vital signs reviewed and stable  Post vital signs:  Reviewed and stable  Last Vitals:  Vitals:   11/26/22 1132 11/26/22 1510  BP: (!) 171/116 (!) 166/108  Pulse: 65 74  Resp: 15 12  Temp: 36.9 C 36.6 C  SpO2: 98% 100%    Complications: No apparent anesthesia complications

## 2022-11-26 NOTE — Discharge Instructions (Addendum)
HERNIA REPAIR: POST OP INSTRUCTIONS   EAT Gradually transition to a high fiber diet with a fiber supplement over the next few weeks after discharge.  Start with a pureed / full liquid diet (see below)  WALK Walk an hour a day (cumulative- not all at once).  Control your pain to do that.    CONTROL PAIN Control pain so that you can walk, sleep, tolerate sneezing/coughing, and go up/down stairs.  HAVE A BOWEL MOVEMENT DAILY Keep your bowels regular to avoid problems.  OK to try a laxative to override constipation.  OK to use an antidiarrheal to slow down diarrhea.  Call if not better after 2 tries  CALL IF YOU HAVE PROBLEMS/CONCERNS Call if you are still struggling despite following these instructions. Call if you have concerns not answered by these instructions  ######################################################################    DIET: Follow a light bland diet & liquids the first 24 hours after arrival home, such as soup, liquids, starches, etc.  Be sure to drink plenty of fluids.  Quickly advance to a usual solid diet within a few days.  Avoid fast food or heavy meals initially as you are more likely to get nauseated or have irregular bowels.     Take your usually prescribed home medications unless otherwise directed.  PAIN CONTROL: Pain is best controlled by a usual combination of three different methods TOGETHER: Ice/Heat Over the counter pain medication Prescription pain medication Most patients will experience some swelling and bruising around the hernia(s) and into the scrotum.  Ice packs or heating pads (30-60 minutes up to 6 times a day) will help. Use ice for the first few days to help decrease swelling and bruising, then switch to heat to help relax tight/sore spots and speed recovery.  Some people prefer to use ice alone, heat alone, alternating between ice & heat.  Experiment to what works for you.  Swelling and bruising can take several weeks to resolve.   It is  helpful to take an over-the-counter pain medication regularly for the first days: Naproxen (Aleve, etc)  Two 220mg  tabs twice a day OR Ibuprofen (Advil, etc) Three 200mg  tabs four times a day (every meal & bedtime) AND Acetaminophen (Tylenol, etc) 325-650mg  four times a day (every meal & bedtime) A  prescription for pain medication should be given to you upon discharge.  Take your pain medication as prescribed, IF NEEDED.  If you are having problems/concerns with the prescription medicine (does not control pain, nausea, vomiting, rash, itching, etc), please call us [phone] to see if we need to switch you to a different pain medicine that will work better for you and/or control your side effect better. If you need a refill on your pain medication, please contact your pharmacy.  They will contact our office to request authorization. Prescriptions will not be filled after 5 pm or on week-ends.  Avoid getting constipated.  Between the surgery and the pain medications, it is common to experience some constipation.  Increasing fluid intake and taking a fiber supplement (such as Metamucil, Citrucel, FiberCon, MiraLax, etc) 1-2 times a day regularly will usually help prevent this problem from occurring.  A mild laxative (prune juice, Milk of Magnesia, MiraLax, etc) should be taken according to package directions if there are no bowel movements after 48 hours.    Wash / shower every day, starting 2 days after surgery.  You may shower over the skin glue which are waterproof.  No rubbing, scrubbing, lotions or ointments to incision(s).  Do not soak or submerge incisions.   Glue will flake off after about 2 weeks.  You may leave the incisions open to air.  You may replace a dressing/Band-Aid to cover an incisions for comfort if you wish.  Continue to shower over incision(s) after the dressing is off.  ACTIVITIES as tolerated:   You may resume regular (light) daily activities beginning the next day--such as  daily self-care, walking, climbing stairs--gradually increasing activities as tolerated.  Control your pain so that you can walk an hour a day.  If you can walk 30 minutes without difficulty, it is safe to try more intense activity such as jogging, treadmill, bicycling, low-impact aerobics, swimming, etc. Refrain from the most intensive and strenuous activity such as sit-ups, heavy lifting, contact sports, etc  Refrain from any heavy lifting or straining (more than 20lb) until 6 weeks after surgery.   DO NOT PUSH THROUGH PAIN.  Let pain be your guide: If it hurts to do something, don't do it.  Pain is your body warning you to avoid that activity for another week until the pain goes down. You may drive when you are no longer taking prescription pain medication, you can comfortably wear a seatbelt, and you can safely maneuver your car and apply brakes. You may have sexual intercourse when it is comfortable.   FOLLOW UP in our office Please call [facility] at [phone] to set up an appointment to see your surgeon in the office for a follow-up appointment approximately 2-3 weeks after your surgery. Make sure that you call for this appointment the day you arrive home to insure a convenient appointment time.  9.  If you have disability of FMLA / Family leave forms, please bring the forms to the office for processing.  (do not give to your surgeon).  WHEN TO CALL us [phone]: Poor pain control Reactions / problems with new medications (rash/itching, nausea, etc)  Fever over 101.5 F (38.5 C) Inability to urinate Nausea and/or vomiting Worsening swelling or bruising Continued bleeding from incision. Increased pain, redness, or drainage from the incision   The clinic staff is available to answer your questions during regular business hours (8:30am-5pm).  Please don't hesitate to call and ask to speak to one of our nurses for clinical concerns.   If you have a medical emergency, go to the nearest  emergency room or call 911.  A surgeon from [facility] is always on call at the hospitals in [facility], [place] [address], [place], [place]  [address] ?  [address] [address], [place], [place]   [address] MAIN: [phone] ? TOLL FREE: [phone] ? FAX: [phone] [email or web address]

## 2022-11-26 NOTE — Interval H&P Note (Signed)
History and Physical Interval Note:  11/26/2022 12:05 PM  Carl Wallace  has presented today for surgery, with the diagnosis of BILATERAL INGUINAL HERNIAS.  The various methods of treatment have been discussed with the patient and family. After consideration of risks, benefits and other options for treatment, the patient has consented to  Procedure(s): ROBOTIC BILATERAL INGUINAL HERNIA REPAIRS (Bilateral) as a surgical intervention.  The patient's history has been reviewed, patient examined, no change in status, stable for surgery.  I have reviewed the patient's chart and labs.  Questions were answered to the patient's satisfaction.     [name]

## 2022-11-27 ENCOUNTER — Encounter (HOSPITAL_COMMUNITY): Payer: Self-pay | Admitting: Surgery

## 2022-11-27 ENCOUNTER — Encounter: Payer: Self-pay | Admitting: Internal Medicine

## 2022-11-28 ENCOUNTER — Telehealth: Payer: BC Managed Care – PPO | Admitting: Medical

## 2022-11-28 VITALS — BP 150/102 | Wt 252.0 lb

## 2022-11-28 DIAGNOSIS — Z9889 Other specified postprocedural states: Secondary | ICD-10-CM

## 2022-11-28 DIAGNOSIS — I10 Essential (primary) hypertension: Secondary | ICD-10-CM

## 2022-11-28 NOTE — Progress Notes (Signed)
Subjective:     Patient ID: Carl Wallace, male   DOB: [birth year], 47 y.o.   MRN: [id number]  This visit type was conducted due to national recommendations for restrictions regarding the COVID-19 Pandemic (e.g. social distancing) in an effort to limit this patient's exposure and mitigate transmission in our community.  Due to their co-morbid illnesses, this patient is at least at moderate risk for complications without adequate follow up.  This format is felt to be most appropriate for this patient at this time.    Documentation for virtual audio and video telecommunications through [place] encounter:  The patient was located at home. The provider was located in the office. The patient did consent to this visit and is aware of possible charges through their insurance for this visit.  The other persons participating in this telemedicine service were none. Time spent on call was 20 minutes and in review of previous records 20 minutes total.  This virtual service is not related to other E/M service within previous 7 days.   HPI Chief Complaint  Patient presents with   Medical Management of Chronic Issues    Bp high, reading 154/104, 146/102, 132/102, 153/94, 153/97, 165/114,   Virtual for BP concerns.  About a week after recent physical visit here, he bought a BP cuff.  Been monitoring BP.  Getting all high numbers recently but also been in some pain.     Has been working on lifestyle, diet changes to improve on BP since his recent well visit here.  Just recently had colonoscopy and hernia surgery.  Doing ok post op.  No other aggravating or relieving factors. No other complaint.   Past Medical History:  Diagnosis Date   Allergic rhinitis    Childhood asthma    Genital herpes    H/O testicular mass    evaluatd by Urology 2006   Hypertension 04/2015   Knee injury    left knee remote past   Knee pain, bilateral    Left knee injury    remote past with muscle tear   Low  back pain    Wears glasses    alternates between glasses and contacts   Current Outpatient Medications on File Prior to Visit  Medication Sig Dispense Refill   cetirizine (ZYRTEC) 10 MG tablet Take 10 mg by mouth daily as needed for allergies.     docusate sodium (COLACE) 100 MG capsule Take 1 capsule (100 mg total) by mouth 2 (two) times daily. Okay to decrease to once daily or stop taking if having loose bowel movements 30 capsule 0   fluticasone (FLONASE) 50 MCG/ACT nasal spray Place 2 sprays into both nostrils daily as needed for allergies or rhinitis.     ibuprofen (ADVIL) 200 MG tablet Take 600-800 mg by mouth every 6 (six) hours as needed for moderate pain.     oxyCODONE (ROXICODONE) 5 MG immediate release tablet Take 1 tablet (5 mg total) by mouth every 8 (eight) hours as needed for up to 5 days. Alternate tylenol and ibuprofen for the first few days. Take narcotic pain medication only if needed for severe/ breakthrough pain. 15 tablet 0   valACYclovir (VALTREX) 500 MG tablet 1 tablet po BID x 3 days for flare up (Patient not taking: Reported on 11/28/2022) 30 tablet 3   No current facility-administered medications on file prior to visit.     Review of Systems As in subjective    Objective:   Physical Exam Due to coronavirus pandemic stay  at home measures, patient visit was virtual and they were not examined in person.   BP (!) 150/102   Wt 252 lb (114.3 kg)   BMI 29.88 kg/m   Gen: wd, wn, nad        Assessment:     Encounter Diagnoses  Name Primary?   Essential hypertension, benign Yes   History of surgery        Plan:     Counseled on his recent blood pressure findings.  We discussed risk of uncontrolled high blood pressure.  His last blood pressure here was borderline whereas his home readings are much higher.  He also is in some pain to his recent surgery for hernia repair and colonoscopy.  He will bring his cuff and do a side-by-side comparison here with our  cuff to see if his cuff is running higher or inaccurate.  He is already started making lifestyle changes.  Counseled on diet and exercise.  Counseled on efforts to lose weight.  We discussed if he continues to be stage II readings we will need to initiate medication.  Follow-up in the next few weeks for blood pressure comparison  Avyaan was seen today for medical management of chronic issues.  Diagnoses and all orders for this visit:  Essential hypertension, benign  History of surgery   F/u a few weeks for BP check with his cuff and our cuff

## 2022-12-03 ENCOUNTER — Other Ambulatory Visit: Payer: BC Managed Care – PPO

## 2022-12-03 ENCOUNTER — Telehealth: Payer: Self-pay

## 2022-12-03 NOTE — Telephone Encounter (Signed)
Pt. Came in today for a nurse visit to check his BP on his machine to see if it was accurate or not. His machine read 171/106 pulse 72 and I got 170/100 pulse 70. He stated he is not on BP medicine and is trying to not be on one. He has made changes in his diet and exercise. He stated he has lost 5 pounds and cut out all red meat from his diet. Vincenza Hews out of town so sending it to you as well.

## 2024-03-29 ENCOUNTER — Telehealth: Payer: Self-pay | Admitting: Medical

## 2024-03-29 NOTE — Telephone Encounter (Signed)
 Copied from CRM #[id number]. Topic: Clinical - Medication Refill >> Mar 29, 2024  5:02 PM Delon HERO wrote: Medication: valACYclovir  (VALTREX ) 500 MG tablet [[id number]]  Has the patient contacted their pharmacy? Yes (Agent: If no, request that the patient contact the pharmacy for the refill. If patient does not wish to contact the pharmacy document the reason why and proceed with request.) (Agent: If yes, when and what did the pharmacy advise?)  This is the patient's preferred pharmacy:  [facility] [phone] [facility], [place] - [address] [place] [address] Phone: [phone] Fax: [phone]  Is this the correct pharmacy for this prescription? Yes If no, delete pharmacy and type the correct one.   Has the prescription been filled recently? Yes  Is the patient out of the medication? Yes  Has the patient been seen for an appointment in the last year OR does the patient have an upcoming appointment? Yes  Can we respond through MyChart? Yes  Agent: Please be advised that Rx refills may take up to 3 business days. We ask that you follow-up with your pharmacy.

## 2024-03-31 ENCOUNTER — Ambulatory Visit (INDEPENDENT_AMBULATORY_CARE_PROVIDER_SITE_OTHER)

## 2024-03-31 DIAGNOSIS — Z23 Encounter for immunization: Secondary | ICD-10-CM

## 2024-03-31 NOTE — Telephone Encounter (Signed)
 Patient was already aware he needs an appointment for medication

## 2024-03-31 NOTE — Progress Notes (Unsigned)
 Patient is in office today for a nurse visit for Immunization. Patient Injection was given in the  Right deltoid. Patient tolerated injection well.

## 2024-04-05 ENCOUNTER — Other Ambulatory Visit: Payer: Self-pay | Admitting: Medical

## 2024-04-05 MED ORDER — VALACYCLOVIR HCL 500 MG PO TABS: ORAL_TABLET | ORAL | 0 refills | Status: DC

## 2024-04-05 NOTE — Telephone Encounter (Signed)
 Copied from CRM #[id number]. Topic: Clinical - Prescription Issue >> Apr 05, 2024 10:29 AM Carl Wallace wrote: Reason for CRM: Patient requested a refill of valACYclovir  (VALTREX ) 500 MG tablet. Was denied stating he needed an appointment. Patient scheduled for 04/13/24 but would like to see if he is able to get the refill now as he just took his last pill today and will be unable to wait until next week.  Patient can be reached at [phone]

## 2024-04-13 ENCOUNTER — Ambulatory Visit: Admitting: Medical

## 2024-04-13 VITALS — BP 140/80 | HR 74 | Wt 264.4 lb

## 2024-04-13 DIAGNOSIS — Z13220 Encounter for screening for lipoid disorders: Secondary | ICD-10-CM

## 2024-04-13 DIAGNOSIS — R030 Elevated blood-pressure reading, without diagnosis of hypertension: Secondary | ICD-10-CM | POA: Diagnosis not present

## 2024-04-13 DIAGNOSIS — Z0000 Encounter for general adult medical examination without abnormal findings: Secondary | ICD-10-CM

## 2024-04-13 DIAGNOSIS — Z125 Encounter for screening for malignant neoplasm of prostate: Secondary | ICD-10-CM | POA: Diagnosis not present

## 2024-04-13 DIAGNOSIS — Z8042 Family history of malignant neoplasm of prostate: Secondary | ICD-10-CM

## 2024-04-13 MED ORDER — VALACYCLOVIR HCL 500 MG PO TABS: ORAL_TABLET | ORAL | 0 refills | Status: AC

## 2024-04-13 MED ORDER — CETIRIZINE HCL 10 MG PO TABS: 10.0000 mg | ORAL_TABLET | Freq: Every day | ORAL | 3 refills | Status: AC | PRN

## 2024-04-13 MED ORDER — FLUTICASONE PROPIONATE 50 MCG/ACT NA SUSP: 2.0000 | Freq: Every day | NASAL | 11 refills | Status: AC | PRN

## 2024-04-13 NOTE — Progress Notes (Signed)
 Subjective:   HPI  Carl Wallace is a 48 y.o. male who presents for Chief Complaint  Patient presents with   Medical Management of Chronic Issues    Med check, no concerns    Patient Care Team: Renesmay Nesbitt, Alm RAMAN, PA-C as PCP - General (Family Medicine) Sheldon Standing, MD as Consulting Physician (General Surgery) Sees dentist Sees eye doctor Dr. Renaye Sous, GI   Concerns: Carl Wallace is a 48 year old male who presents for an annual physical exam and medication refill.  His last visit was over a year ago, and he is due for routine labs. He has not eaten today.  He is currently taking Zyrtec  and Flonase  for allergies, which he sometimes gets over the counter, and Valtrex . He has no known allergies to medications.  He has a history of borderline high blood pressure diagnosed a year and a half ago. He managed to control it through dietary changes, including eliminating red meat and increasing water intake, and by exercising more frequently. However, he has gained weight recently due to decreased exercise during colder months. He has a blood pressure cuff at home but has not been monitoring his blood pressure regularly since losing weight and not experiencing headaches.  He does not smoke and consumes alcohol occasionally. His family history includes hypertension in both parents.   Reviewed their medical, surgical, family, social, medication, and allergy history and updated chart as appropriate.  Past Medical History:  Diagnosis Date   Allergic rhinitis    Childhood asthma    Genital herpes    H/O testicular mass    evaluatd by Urology 2006   Hypertension 04/2015   Knee injury    left knee remote past   Knee pain, bilateral    Left knee injury    remote past with muscle tear   Low back pain    Wears glasses    alternates between glasses and contacts    Past Surgical History:  Procedure Laterality Date   XI ROBOTIC ASSISTED INGUINAL HERNIA REPAIR WITH MESH  Bilateral 11/26/2022   Procedure: ROBOTIC BILATERAL INGUINAL HERNIA REPAIRS WITH MESH;  Surgeon: Signe Mitzie LABOR, MD;  Location: [facility];  Service: General;  Laterality: Bilateral;    Family History  Problem Relation Age of Onset   Hypertension Mother    Hypertension Father    Hypertension Brother    Asthma Daughter    Cancer Maternal Uncle        prostate   Diabetes Maternal Grandmother    Hypertension Maternal Grandmother      Current Outpatient Medications:    ibuprofen (ADVIL) 200 MG tablet, Take 600-800 mg by mouth every 6 (six) hours as needed for moderate pain., Disp: , Rfl:    cetirizine  (ZYRTEC ) 10 MG tablet, Take 1 tablet (10 mg total) by mouth daily as needed for allergies., Disp: 90 tablet, Rfl: 3   fluticasone  (FLONASE ) 50 MCG/ACT nasal spray, Place 2 sprays into both nostrils daily as needed for allergies or rhinitis., Disp: 16 g, Rfl: 11   valACYclovir  (VALTREX ) 500 MG tablet, 1 tablet po BID x 3 days for flare up, Disp: 60 tablet, Rfl: 0  No Known Allergies   Review of Systems  Constitutional:  Negative for chills, fever, malaise/fatigue and weight loss.  HENT:  Negative for congestion, ear pain, hearing loss, sore throat and tinnitus.   Eyes:  Negative for blurred vision, pain and redness.  Respiratory:  Negative for cough, hemoptysis and shortness of breath.  Cardiovascular:  Negative for chest pain, palpitations, orthopnea, claudication and leg swelling.  Gastrointestinal:  Negative for abdominal pain, blood in stool, constipation, diarrhea, nausea and vomiting.  Genitourinary:  Negative for dysuria, flank pain, frequency, hematuria and urgency.  Musculoskeletal:  Negative for falls, joint pain and myalgias.  Skin:  Negative for itching and rash.  Neurological:  Negative for dizziness, tingling, speech change, weakness and headaches.  Endo/Heme/Allergies:  Negative for polydipsia. Does not bruise/bleed easily.  Psychiatric/Behavioral:  Negative for  depression and memory loss. The patient is not nervous/anxious and does not have insomnia.         04/13/2024   12:09 PM 10/22/2022   10:23 AM 10/08/2021    8:38 AM 09/11/2020   12:08 PM 10/09/2016    1:52 PM  Depression screen PHQ 2/9  Decreased Interest 0 0 0 0 0  Down, Depressed, Hopeless 0 0 0 0 0  PHQ - 2 Score 0 0 0 0 0        Objective:  BP (!) 140/80   Pulse 74   Wt 264 lb 6.4 oz (119.9 kg)   SpO2 99%   BMI 31.35 kg/m   BP Readings from Last 3 Encounters:  04/13/24 (!) 140/80  12/03/22 (!) 170/100  11/28/22 (!) 150/102   Wt Readings from Last 3 Encounters:  04/13/24 264 lb 6.4 oz (119.9 kg)  11/28/22 252 lb (114.3 kg)  11/26/22 255 lb (115.7 kg)   General appearance: alert, no distress, WD/WN, African American male Skin: unremarkable HEENT: normocephalic, conjunctiva/corneas normal, sclerae anicteric, PERRLA, EOMi, nares patent, no discharge or erythema, pharynx normal Oral cavity: MMM, tongue normal, teeth in good repair Neck: supple, no lymphadenopathy, no thyromegaly, no masses, normal ROM, no bruits Chest: non tender, normal shape and expansion Heart: RRR, normal S1, S2, no murmurs Lungs: CTA bilaterally, no wheezes, rhonchi, or rales Abdomen: +bs, soft, non tender, non distended, no masses, no hepatomegaly, no splenomegaly, no bruits Back: non tender, normal ROM, no scoliosis Musculoskeletal: upper extremities non tender, no obvious deformity, normal ROM throughout, lower extremities non tender, no obvious deformity, normal ROM throughout Extremities: no edema, no cyanosis, no clubbing Pulses: 2+ symmetric, upper and lower extremities, normal cap refill Neurological: alert, oriented x 3, CN2-12 intact, strength normal upper extremities and lower extremities, sensation normal throughout, DTRs 2+ throughout, no cerebellar signs, gait normal Psychiatric: normal affect, behavior normal, pleasant  GU/rectal - deferred/declined   Assessment and Plan :    Encounter Diagnoses  Name Primary?   Encounter for health maintenance examination in adult Yes   Family history of prostate cancer    Screening for prostate cancer    Screening for lipid disorders    Elevated blood-pressure reading without diagnosis of hypertension      This visit was a preventative care visit, also known as wellness visit or routine physical.   Topics typically include healthy lifestyle, diet, exercise, preventative care, vaccinations, sick and well care, proper use of emergency dept and after hours care, as well as other concerns.     Recommendations: Continue to return yearly for your annual wellness and preventative care visits.  This gives us  a chance to discuss healthy lifestyle, exercise, vaccinations, review your chart record, and perform screenings where appropriate.  I recommend you see your eye doctor yearly for routine vision care.  I recommend you see your dentist yearly for routine dental care including hygiene visits twice yearly.   Vaccination recommendations were reviewed Immunization History  Administered Date(s) Administered  Influenza, Seasonal, Injecte, Preservative Fre 03/31/2024   Influenza,inj,Quad PF,6+ Mos 07/20/2014, 05/18/2015   Influenza-Unspecified 01/19/2016, 01/07/2019   PFIZER(Purple Top)SARS-COV-2 Vaccination 07/10/2019, 08/03/2019, 02/28/2020   Pneumococcal Polysaccharide-23 10/08/2021   Td 07/20/2014   Due for tetanus 2026 Consider shingrix vaccine at age 42   Screening for cancer: Colon cancer screening: 10/2022 colonoscopy reviewed.  Dr. Kristie.    Testicular cancer screening You should do a monthly self testicular exam if you are between 73-68 years old  We discussed PSA, prostate exam, and prostate cancer screening risks/benefits.     Skin cancer screening: Check your skin regularly for new changes, growing lesions, or other lesions of concern Come in for evaluation if you have skin lesions of concern.  Lung  cancer screening: If you have a greater than 20 pack year history of tobacco use, then you may qualify for lung cancer screening with a chest CT scan.   Please call your insurance company to inquire about coverage for this test.  We currently don't have screenings for other cancers besides breast, cervical, colon, and lung cancers.  If you have a strong family history of cancer or have other cancer screening concerns, please let me know.    Bone health: Get at least 150 minutes of aerobic exercise weekly Get weight bearing exercise at least once weekly Bone density test:  A bone density test is an imaging test that uses a type of X-ray to measure the amount of calcium and other minerals in your bones. The test may be used to diagnose or screen you for a condition that causes weak or thin bones (osteoporosis), predict your risk for a broken bone (fracture), or determine how well your osteoporosis treatment is working. The bone density test is recommended for females 65 and older, or females or males <65 if certain risk factors such as thyroid  disease, long term use of steroids such as for asthma or rheumatological issues, vitamin D deficiency, estrogen deficiency, family history of osteoporosis, self or family history of fragility fracture in first degree relative.    Heart health: Get at least 150 minutes of aerobic exercise weekly Limit alcohol It is important to maintain a healthy blood pressure and healthy cholesterol numbers  Heart disease screening: Screening for heart disease includes screening for blood pressure, fasting lipids, glucose/diabetes screening, BMI height to weight ratio, reviewed of smoking status, physical activity, and diet.    Goals include blood pressure 120/80 or less, maintaining a healthy lipid/cholesterol profile, preventing diabetes or keeping diabetes numbers under good control, not smoking or using tobacco products, exercising most days per week or at least 150  minutes per week of exercise, and eating healthy variety of fruits and vegetables, healthy oils, and avoiding unhealthy food choices like fried food, fast food, high sugar and high cholesterol foods.    Other tests may possibly include EKG test, CT coronary calcium score, echocardiogram, exercise treadmill stress test.     Medical care options: I recommend you continue to seek care here first for routine care.  We try really hard to have available appointments Monday through Friday daytime hours for sick visits, acute visits, and physicals.  Urgent care should be used for after hours and weekends for significant issues that cannot wait till the next day.  The emergency department should be used for significant potentially life-threatening emergencies.  The emergency department is expensive, can often have long wait times for less significant concerns, so try to utilize primary care, urgent care, or telemedicine when  possible to avoid unnecessary trips to the emergency department.  Virtual visits and telemedicine have been introduced since the pandemic started in 2020, and can be convenient ways to receive medical care.  We offer virtual appointments as well to assist you in a variety of options to seek medical care.    Separate significant issues discussed: Elevated blood pressure Blood pressure elevated at 140/80 mmHg, trending towards hypertension. Discussed risks of untreated hypertension and importance of control. Discussed lifestyle modifications and potential need for medication. - Monitor blood pressure at home for two weeks and report readings. - Consider antihypertensive medication if home readings confirm elevation. - Ordered routine labs: blood count, liver, kidney, electrolyte, cholesterol, prostate, urine tests. - Ordered microalbumin test for kidney function.  Hx/o HSV - refilled valtrex  for prn use   Issak was seen today for medical management of chronic issues.  Diagnoses  and all orders for this visit:  Encounter for health maintenance examination in adult -     CBC -     Comprehensive metabolic panel with GFR -     Lipid panel -     PSA -     Urinalysis, Routine w reflex microscopic -     Microalbumin/Creatinine Ratio, Urine  Family history of prostate cancer -     PSA  Screening for prostate cancer -     PSA  Screening for lipid disorders -     Lipid panel  Elevated blood-pressure reading without diagnosis of hypertension -     Urinalysis, Routine w reflex microscopic -     Microalbumin/Creatinine Ratio, Urine  Other orders -     valACYclovir  (VALTREX ) 500 MG tablet; 1 tablet po BID x 3 days for flare up -     cetirizine  (ZYRTEC ) 10 MG tablet; Take 1 tablet (10 mg total) by mouth daily as needed for allergies. -     fluticasone  (FLONASE ) 50 MCG/ACT nasal spray; Place 2 sprays into both nostrils daily as needed for allergies or rhinitis.     Follow-up pending labs, yearly for physical

## 2024-04-13 NOTE — Progress Notes (Signed)
 Pt is due in 3034 for next Colonoscopy. She will send report over

## 2024-04-14 ENCOUNTER — Ambulatory Visit: Payer: Self-pay | Admitting: Medical

## 2024-04-14 LAB — COMPREHENSIVE METABOLIC PANEL WITH GFR
ALT: 16 IU/L (ref 0–44)
AST: 20 IU/L (ref 0–40)
Albumin: 4.5 g/dL (ref 4.1–5.1)
Alkaline Phosphatase: 48 IU/L (ref 47–123)
BUN/Creatinine Ratio: 11 (ref 9–20)
BUN: 12 mg/dL (ref 6–24)
Bilirubin Total: 1 mg/dL (ref 0.0–1.2)
CO2: 27 mmol/L (ref 20–29)
Calcium: 9.4 mg/dL (ref 8.7–10.2)
Chloride: 100 mmol/L (ref 96–106)
Creatinine, Ser: 1.07 mg/dL (ref 0.76–1.27)
Globulin, Total: 2.4 g/dL (ref 1.5–4.5)
Glucose: 87 mg/dL (ref 70–99)
Potassium: 4 mmol/L (ref 3.5–5.2)
Sodium: 141 mmol/L (ref 134–144)
Total Protein: 6.9 g/dL (ref 6.0–8.5)
eGFR: 85 mL/min/1.73 (ref >59)

## 2024-04-14 LAB — CBC
Hematocrit: 45.1 % (ref 37.5–51.0)
Hemoglobin: 14.7 g/dL (ref 13.0–17.7)
MCH: 27.7 pg (ref 26.6–33.0)
MCHC: 32.6 g/dL (ref 31.5–35.7)
MCV: 85 fL (ref 79–97)
Platelets: 284 x10E3/uL (ref 150–450)
RBC: 5.31 x10E6/uL (ref 4.14–5.80)
RDW: 12.2 % (ref 11.6–15.4)
WBC: 7.5 x10E3/uL (ref 3.4–10.8)

## 2024-04-14 LAB — PSA: Prostate Specific Ag, Serum: 2.2 ng/mL (ref 0.0–4.0)

## 2024-04-14 LAB — URINALYSIS, ROUTINE W REFLEX MICROSCOPIC
Bilirubin, UA: NEGATIVE
Glucose, UA: NEGATIVE
Ketones, UA: NEGATIVE
Leukocytes,UA: NEGATIVE
Nitrite, UA: NEGATIVE
Protein,UA: NEGATIVE
RBC, UA: NEGATIVE
Specific Gravity, UA: 1.011 (ref 1.005–1.030)
Urobilinogen, Ur: 0.2 mg/dL (ref 0.2–1.0)
pH, UA: 6.5 (ref 5.0–7.5)

## 2024-04-14 LAB — MICROALBUMIN / CREATININE URINE RATIO
Creatinine, Urine: 74.6 mg/dL
Microalb/Creat Ratio: 4 mg/g{creat} (ref 0–29)
Microalbumin, Urine: 3.3 ug/mL

## 2024-04-14 LAB — LIPID PANEL
Chol/HDL Ratio: 3.4 ratio (ref 0.0–5.0)
Cholesterol, Total: 186 mg/dL (ref 100–199)
HDL: 54 mg/dL (ref >39)
LDL Chol Calc (NIH): 115 mg/dL — ABNORMAL HIGH (ref 0–99)
Triglycerides: 95 mg/dL (ref 0–149)
VLDL Cholesterol Cal: 17 mg/dL (ref 5–40)

## 2024-04-14 NOTE — Progress Notes (Signed)
 Results thru my chart

## 2024-04-28 DIAGNOSIS — M9901 Segmental and somatic dysfunction of cervical region: Secondary | ICD-10-CM | POA: Diagnosis not present

## 2024-04-28 DIAGNOSIS — M9902 Segmental and somatic dysfunction of thoracic region: Secondary | ICD-10-CM | POA: Diagnosis not present

## 2024-04-28 DIAGNOSIS — M9903 Segmental and somatic dysfunction of lumbar region: Secondary | ICD-10-CM | POA: Diagnosis not present

## 2024-04-28 DIAGNOSIS — M9905 Segmental and somatic dysfunction of pelvic region: Secondary | ICD-10-CM | POA: Diagnosis not present

## 2024-06-10 ENCOUNTER — Ambulatory Visit: Admitting: Medical

## 2024-07-13 ENCOUNTER — Encounter: Admitting: Medical

## 2025-04-19 ENCOUNTER — Encounter: Admitting: Medical
